# Patient Record
Sex: Female | Born: 1960 | Race: Black or African American | Hispanic: No | Marital: Married | Smoking: Never smoker
Health system: Southern US, Community
[De-identification: ages and names within clinical notes are randomized; demographics above are authoritative.]

## PROBLEM LIST (undated history)

## (undated) DIAGNOSIS — I10 Essential (primary) hypertension: Secondary | ICD-10-CM

## (undated) DIAGNOSIS — M1990 Unspecified osteoarthritis, unspecified site: Secondary | ICD-10-CM

## (undated) DIAGNOSIS — R3129 Other microscopic hematuria: Secondary | ICD-10-CM

## (undated) DIAGNOSIS — Z86018 Personal history of other benign neoplasm: Secondary | ICD-10-CM

## (undated) DIAGNOSIS — Z86010 Personal history of colonic polyps: Secondary | ICD-10-CM

## (undated) DIAGNOSIS — N2889 Other specified disorders of kidney and ureter: Secondary | ICD-10-CM

## (undated) DIAGNOSIS — E785 Hyperlipidemia, unspecified: Secondary | ICD-10-CM

## (undated) DIAGNOSIS — N289 Disorder of kidney and ureter, unspecified: Secondary | ICD-10-CM

## (undated) DIAGNOSIS — Z860101 Personal history of adenomatous and serrated colon polyps: Secondary | ICD-10-CM

## (undated) DIAGNOSIS — Z973 Presence of spectacles and contact lenses: Secondary | ICD-10-CM

## (undated) DIAGNOSIS — K219 Gastro-esophageal reflux disease without esophagitis: Secondary | ICD-10-CM

## (undated) HISTORY — DX: Essential (primary) hypertension: I10

## (undated) HISTORY — PX: TONSILLECTOMY: SUR1361

---

## 2001-04-07 ENCOUNTER — Ambulatory Visit (HOSPITAL_COMMUNITY): Admission: RE | Admit: 2001-04-07 | Discharge: 2001-04-07 | Payer: Self-pay | Admitting: *Deleted

## 2002-07-19 ENCOUNTER — Encounter: Payer: Self-pay | Admitting: Family Medicine

## 2002-07-19 ENCOUNTER — Ambulatory Visit (HOSPITAL_COMMUNITY): Admission: RE | Admit: 2002-07-19 | Discharge: 2002-07-19 | Payer: Self-pay | Admitting: Family Medicine

## 2002-07-19 ENCOUNTER — Encounter (INDEPENDENT_AMBULATORY_CARE_PROVIDER_SITE_OTHER): Payer: Self-pay | Admitting: *Deleted

## 2003-05-09 ENCOUNTER — Emergency Department (HOSPITAL_COMMUNITY): Admission: EM | Admit: 2003-05-09 | Discharge: 2003-05-09 | Payer: Self-pay | Admitting: Emergency Medicine

## 2004-06-26 ENCOUNTER — Ambulatory Visit (HOSPITAL_COMMUNITY): Admission: RE | Admit: 2004-06-26 | Discharge: 2004-06-26 | Payer: Self-pay | Admitting: Family Medicine

## 2005-03-31 ENCOUNTER — Other Ambulatory Visit: Admission: RE | Admit: 2005-03-31 | Discharge: 2005-03-31 | Payer: Self-pay | Admitting: Family Medicine

## 2005-05-24 ENCOUNTER — Ambulatory Visit (HOSPITAL_COMMUNITY): Admission: RE | Admit: 2005-05-24 | Discharge: 2005-05-24 | Payer: Self-pay | Admitting: *Deleted

## 2005-05-24 ENCOUNTER — Encounter (INDEPENDENT_AMBULATORY_CARE_PROVIDER_SITE_OTHER): Payer: Self-pay | Admitting: Specialist

## 2005-07-16 ENCOUNTER — Encounter: Admission: RE | Admit: 2005-07-16 | Discharge: 2005-07-16 | Payer: Self-pay | Admitting: Family Medicine

## 2005-12-06 ENCOUNTER — Emergency Department (HOSPITAL_COMMUNITY): Admission: EM | Admit: 2005-12-06 | Discharge: 2005-12-06 | Payer: Self-pay | Admitting: Emergency Medicine

## 2006-02-14 ENCOUNTER — Encounter: Admission: RE | Admit: 2006-02-14 | Discharge: 2006-02-14 | Payer: Self-pay | Admitting: Family Medicine

## 2006-07-13 ENCOUNTER — Ambulatory Visit (HOSPITAL_COMMUNITY): Admission: RE | Admit: 2006-07-13 | Discharge: 2006-07-13 | Payer: Self-pay | Admitting: Family Medicine

## 2007-07-17 ENCOUNTER — Ambulatory Visit (HOSPITAL_COMMUNITY): Admission: RE | Admit: 2007-07-17 | Discharge: 2007-07-17 | Payer: Self-pay | Admitting: Family Medicine

## 2007-09-07 ENCOUNTER — Other Ambulatory Visit: Admission: RE | Admit: 2007-09-07 | Discharge: 2007-09-07 | Payer: Self-pay | Admitting: Family Medicine

## 2008-09-09 ENCOUNTER — Ambulatory Visit (HOSPITAL_COMMUNITY): Admission: RE | Admit: 2008-09-09 | Discharge: 2008-09-09 | Payer: Self-pay | Admitting: Gastroenterology

## 2009-08-27 ENCOUNTER — Other Ambulatory Visit: Admission: RE | Admit: 2009-08-27 | Discharge: 2009-08-27 | Payer: Self-pay | Admitting: Family Medicine

## 2010-01-09 ENCOUNTER — Ambulatory Visit (HOSPITAL_COMMUNITY): Admission: RE | Admit: 2010-01-09 | Discharge: 2010-01-09 | Payer: Self-pay | Admitting: Family Medicine

## 2010-09-04 ENCOUNTER — Other Ambulatory Visit: Admission: RE | Admit: 2010-09-04 | Discharge: 2010-09-04 | Payer: Self-pay | Admitting: Family Medicine

## 2010-12-24 ENCOUNTER — Other Ambulatory Visit: Payer: Self-pay | Admitting: Family Medicine

## 2010-12-24 DIAGNOSIS — Z0000 Encounter for general adult medical examination without abnormal findings: Secondary | ICD-10-CM

## 2011-01-11 ENCOUNTER — Ambulatory Visit (HOSPITAL_COMMUNITY)
Admission: RE | Admit: 2011-01-11 | Payer: BC Managed Care – PPO | Source: Ambulatory Visit | Attending: *Deleted | Admitting: *Deleted

## 2011-01-11 ENCOUNTER — Ambulatory Visit (HOSPITAL_COMMUNITY): Admission: RE | Admit: 2011-01-11 | Payer: Self-pay | Source: Home / Self Care | Admitting: Family Medicine

## 2011-01-14 ENCOUNTER — Other Ambulatory Visit: Payer: Self-pay | Admitting: Family Medicine

## 2011-01-14 ENCOUNTER — Other Ambulatory Visit (HOSPITAL_COMMUNITY): Payer: Self-pay | Admitting: Family Medicine

## 2011-01-14 DIAGNOSIS — Z1231 Encounter for screening mammogram for malignant neoplasm of breast: Secondary | ICD-10-CM

## 2011-01-15 ENCOUNTER — Ambulatory Visit (HOSPITAL_COMMUNITY): Payer: BC Managed Care – PPO

## 2011-01-18 ENCOUNTER — Ambulatory Visit (HOSPITAL_COMMUNITY)
Admission: RE | Admit: 2011-01-18 | Discharge: 2011-01-18 | Disposition: A | Discharge status: Home / Self Care | Payer: BC Managed Care – PPO | Source: Ambulatory Visit | Attending: Diagnostic Radiology | Admitting: Diagnostic Radiology

## 2011-01-18 DIAGNOSIS — Z1231 Encounter for screening mammogram for malignant neoplasm of breast: Secondary | ICD-10-CM | POA: Insufficient documentation

## 2011-04-09 ENCOUNTER — Other Ambulatory Visit (HOSPITAL_COMMUNITY): Payer: Self-pay | Admitting: Family Medicine

## 2011-04-09 DIAGNOSIS — Z1231 Encounter for screening mammogram for malignant neoplasm of breast: Secondary | ICD-10-CM

## 2011-09-08 ENCOUNTER — Other Ambulatory Visit (HOSPITAL_COMMUNITY)
Admission: RE | Admit: 2011-09-08 | Discharge: 2011-09-08 | Disposition: A | Discharge status: Home / Self Care | Payer: BC Managed Care – PPO | Source: Ambulatory Visit | Attending: Family Medicine | Admitting: Family Medicine

## 2011-09-08 ENCOUNTER — Other Ambulatory Visit: Payer: Self-pay | Admitting: Family Medicine

## 2011-09-08 DIAGNOSIS — Z124 Encounter for screening for malignant neoplasm of cervix: Secondary | ICD-10-CM | POA: Insufficient documentation

## 2011-12-21 ENCOUNTER — Other Ambulatory Visit: Payer: Self-pay | Admitting: Family Medicine

## 2011-12-21 DIAGNOSIS — N852 Hypertrophy of uterus: Secondary | ICD-10-CM

## 2011-12-28 ENCOUNTER — Other Ambulatory Visit: Payer: BC Managed Care – PPO

## 2011-12-30 ENCOUNTER — Ambulatory Visit
Admission: RE | Admit: 2011-12-30 | Discharge: 2011-12-30 | Disposition: A | Discharge status: Home / Self Care | Payer: BC Managed Care – PPO | Source: Ambulatory Visit | Attending: Family Medicine | Admitting: Family Medicine

## 2011-12-30 DIAGNOSIS — N852 Hypertrophy of uterus: Secondary | ICD-10-CM

## 2012-01-03 ENCOUNTER — Other Ambulatory Visit (HOSPITAL_COMMUNITY): Payer: Self-pay | Admitting: Family Medicine

## 2012-01-03 DIAGNOSIS — Z1231 Encounter for screening mammogram for malignant neoplasm of breast: Secondary | ICD-10-CM

## 2012-01-28 ENCOUNTER — Ambulatory Visit (HOSPITAL_COMMUNITY)
Admission: RE | Admit: 2012-01-28 | Discharge: 2012-01-28 | Disposition: A | Discharge status: Home / Self Care | Payer: BC Managed Care – PPO | Source: Ambulatory Visit | Attending: Family Medicine | Admitting: Family Medicine

## 2012-01-28 DIAGNOSIS — Z1231 Encounter for screening mammogram for malignant neoplasm of breast: Secondary | ICD-10-CM | POA: Insufficient documentation

## 2013-01-31 ENCOUNTER — Other Ambulatory Visit (HOSPITAL_COMMUNITY): Payer: Self-pay | Admitting: Family Medicine

## 2013-02-06 ENCOUNTER — Ambulatory Visit (HOSPITAL_COMMUNITY)
Admission: RE | Admit: 2013-02-06 | Discharge: 2013-02-06 | Disposition: A | Discharge status: Home / Self Care | Payer: BC Managed Care – PPO | Source: Ambulatory Visit | Attending: Family Medicine | Admitting: Family Medicine

## 2013-02-06 DIAGNOSIS — Z1231 Encounter for screening mammogram for malignant neoplasm of breast: Secondary | ICD-10-CM | POA: Insufficient documentation

## 2014-01-04 ENCOUNTER — Other Ambulatory Visit (HOSPITAL_COMMUNITY): Payer: Self-pay | Admitting: Family Medicine

## 2014-01-04 DIAGNOSIS — Z1231 Encounter for screening mammogram for malignant neoplasm of breast: Secondary | ICD-10-CM

## 2014-02-07 ENCOUNTER — Ambulatory Visit (HOSPITAL_COMMUNITY)
Admission: RE | Admit: 2014-02-07 | Discharge: 2014-02-07 | Disposition: A | Discharge status: Home / Self Care | Payer: BC Managed Care – PPO | Source: Ambulatory Visit | Attending: Family Medicine | Admitting: Family Medicine

## 2014-02-07 DIAGNOSIS — Z1231 Encounter for screening mammogram for malignant neoplasm of breast: Secondary | ICD-10-CM

## 2015-01-17 ENCOUNTER — Other Ambulatory Visit (HOSPITAL_COMMUNITY): Payer: Self-pay | Admitting: Family Medicine

## 2015-01-17 DIAGNOSIS — Z1231 Encounter for screening mammogram for malignant neoplasm of breast: Secondary | ICD-10-CM

## 2015-02-13 ENCOUNTER — Ambulatory Visit: Payer: BLUE CROSS/BLUE SHIELD | Attending: Family Medicine | Admitting: Physical Therapy

## 2015-02-13 ENCOUNTER — Encounter: Payer: Self-pay | Admitting: Physical Therapy

## 2015-02-13 DIAGNOSIS — R29898 Other symptoms and signs involving the musculoskeletal system: Secondary | ICD-10-CM | POA: Diagnosis not present

## 2015-02-13 DIAGNOSIS — M25611 Stiffness of right shoulder, not elsewhere classified: Secondary | ICD-10-CM

## 2015-02-13 DIAGNOSIS — M25511 Pain in right shoulder: Secondary | ICD-10-CM | POA: Diagnosis not present

## 2015-02-13 DIAGNOSIS — M7582 Other shoulder lesions, left shoulder: Secondary | ICD-10-CM | POA: Insufficient documentation

## 2015-02-13 DIAGNOSIS — M25512 Pain in left shoulder: Secondary | ICD-10-CM | POA: Insufficient documentation

## 2015-02-13 DIAGNOSIS — M25612 Stiffness of left shoulder, not elsewhere classified: Secondary | ICD-10-CM

## 2015-02-13 NOTE — Therapy (Signed)
[place], [place], [address] Phone: [phone]   Fax:  [phone]  Physical Therapy Evaluation  Patient Details  Name: Diane Anthony MRN: [id number] Date of Birth: [birth year] Referring Provider:  London Pepper, MD  Encounter Date: 02/13/2015      PT End of Session - 02/13/15 1419    Visit Number 1   Number of Visits 12   Date for PT Re-Evaluation 04/15/15   PT Start Time 1330   PT Stop Time 1418   PT Time Calculation (min) 48 min   Activity Tolerance Patient tolerated treatment well   Behavior During Therapy [facility] for tasks assessed/performed      Past Medical History  Diagnosis Date  . Hypertension     No past surgical history on file.  There were no vitals filed for this visit.  Visit Diagnosis:  Bilateral shoulder pain - Plan: PT plan of care cert/re-cert  Weakness of shoulder - Plan: PT plan of care cert/re-cert  Decreased ROM of left shoulder - Plan: PT plan of care cert/re-cert  Decreased ROM of right shoulder - Plan: PT plan of care cert/re-cert      Subjective Assessment - 02/13/15 1335    Symptoms pt is a 54 y.o. F with CC of bilateral shoulder pain with R >L. she states "I believe it was from when some picture frames fell on my R shoulder in 2001".  she reports have difficulty raising her arm up and increased pain during exercise with elbow pain while leaning on it, or using it to turn in bed.   the  pain has gradually gotten worse since onset .   Limitations Lifting;House hold activities;Standing;Walking   How long can you sit comfortably? 2-3 hours   How long can you stand comfortably? >8 hours   How long can you walk comfortably? >8 hours   Diagnostic tests x-ray  in 2001: impression slipped cervical disk and shoulder tendinitis (per patient report)   Patient Stated Goals to be able to use arm more without pain.    Currently in Pain? Yes   Pain Score 6    Pain Location  Shoulder   Pain Orientation Right;Left;Anterior   Pain Descriptors / Indicators Aching   Pain Type Chronic pain   Pain Onset More than a month ago   Pain Frequency Intermittent   Aggravating Factors  overhead lifting, lifting and carrying activites, donning/doffing clothing   Pain Relieving Factors Sit and relax,    Effect of Pain on Daily Activities limited strenght to lift and carry items, and decreased endurance            [facility] PT Assessment - 02/13/15 1343    Assessment   Medical Diagnosis Bil shoulder pain   Onset Date 05/20/00   Next MD Visit no appointment  schedule as needed   Prior Therapy yes  R shoulder/ same pain   Precautions   Precautions None   Restrictions   Weight Bearing Restrictions No   Balance Screen   Has the patient fallen in the past 6 months No   Has the patient had a decrease in activity level because of a fear of falling?  No   Is the patient reluctant to leave their home because of a fear of falling?  No   Home Environment   Living Enviornment Private residence   Living Arrangements Spouse/significant other   Available Help at Discharge Available 24 hours/day   Type of Home House  Home Access --   Home Layout Two level   Alternate Level Stairs-Number of Steps 15   Alternate Level Stairs-Rails Right   Prior Function   Level of Independence Independent with basic ADLs;Independent with homemaking with ambulation;Independent with gait;Independent with transfers   Observation/Other Assessments   Focus on Therapeutic Outcomes (FOTO)  62% impaired  projected change to 36% impaired   Posture/Postural Control   Posture/Postural Control Postural limitations   Postural Limitations Rounded Shoulders;Forward head   ROM / Strength   AROM / PROM / Strength AROM;PROM;Strength   AROM   AROM Assessment Site Shoulder   Right/Left Shoulder Right;Left   Right Shoulder Extension 50 Degrees   Right Shoulder Flexion 85 Degrees  pain at end range   Right  Shoulder ABduction 108 Degrees   Right Shoulder Internal Rotation 16 Degrees   Right Shoulder External Rotation 25 Degrees   Left Shoulder Extension 75 Degrees   Left Shoulder Flexion 82 Degrees  pain during movement   Left Shoulder ABduction 112 Degrees   Left Shoulder Internal Rotation 83 Degrees   Left Shoulder External Rotation 74 Degrees   PROM   Overall PROM  Within functional limits for tasks performed;Other (comment)  BIL   PROM Assessment Site Shoulder   Right/Left Shoulder Right;Left   Strength   Strength Assessment Site Shoulder   Right/Left Shoulder Right;Left   Right Shoulder Flexion 3-/5  pain   Right Shoulder Extension 3-/5  increased pain   Right Shoulder ABduction 3-/5   Right Shoulder Internal Rotation 3-/5   Right Shoulder External Rotation 3+/5   Left Shoulder Flexion 3-/5  pain in shoulder   Left Shoulder Extension 4+/5   Left Shoulder ABduction 3/5   Left Shoulder Internal Rotation 4/5   Left Shoulder External Rotation 4/5   Palpation   Palpation Tenderness in the proximal anterior aspect of Bil shoulders at the long head of the biceps tendon   Special Tests   Rotator Cuff Impingment tests Michel Bickers test;Empty Can test;Full Can test;Neer impingement test;Painful Arc of Motion   Neer Impingement test    Findings Positive   Side Right   Hawkins-Kennedy test   Findings Positive   Side Right   Empty Can test   Findings Positive   Side Right   Full Can test   Findings Positive   Side Right   Painful Arc of Motion   Findings Positive   Side Right                   [facility] Adult PT Treatment/Exercise - 02/13/15 1343    Shoulder Exercises: Standing   External Rotation AROM;Strengthening;Both;10 reps;Theraband   Theraband Level (Shoulder External Rotation) Level 2 (Red)   Internal Rotation AROM;Strengthening;Both;10 reps;Theraband   Theraband Level (Shoulder Internal Rotation) Level 2 (Red)   Other Standing Exercises Wall push-ups  x 10   Shoulder Exercises: Stretch   Other Shoulder Stretches sleeper stretch against the wall  3 x 30 sec                PT Education - 02/13/15 1419    Education provided Yes   Education Details evaluation findings, POC, goals, HEP   Person(s) Educated Patient   Methods Explanation   Comprehension Verbalized understanding          PT Short Term Goals - 02/13/15 1431    PT SHORT TERM GOAL #1   Title Pt will be I with Basic HEP (02/13/15)   Time 3  Period Weeks   Status New           PT Long Term Goals - 02/13/15 1432    PT LONG TERM GOAL #1   Title pt will be independent with advanced HEP (02/13/15)   Time 6   Period Weeks   Status New   PT LONG TERM GOAL #2   Title Pt will increase Bil shoulder strength to > 4/5 to assist with ADLs and donning/doffing clothing (02/13/15)   Time 6   Period Weeks   Status New   PT LONG TERM GOAL #3   Title Pt will have < 2/10 pain during and after lifting and carrying activiites to help with functional progression (02/13/15)   Time 6   Period Weeks   Status New   PT LONG TERM GOAL #4   Title Pt will increase FOTO score to > 65 points to assist with functional capcity (02/13/15)   Time 6   Period Weeks   Status New   PT LONG TERM GOAL #5   Title Pt will be able to verbalize and demonstrate techniques to reduce shoulder reinjury via, posture, lifting mechancis, and HEP (02/13/15)   Time 6   Period Weeks   Status New               Plan - 02/13/15 1420    Clinical Impression Statement Wanita presents to OPPT with CC of Bil shoulder pain R>L. She is tender at the long head of the biceps tendon Bil. pt demonstrates limited AROM  with weakness in all planes secondary to increased pain.  R shoulder demonstrates positive hawkin kennedy, neers, and painful arc that is consitent with sholder impingement. pt reported she plans on getting surgery on her  shoulder but hasn't scheduled anything yet.  She would benefit from  skilled physical therapy to maximize her functinal capacity and reduce pain by addressing the defecits listed.   Pt will benefit from skilled therapeutic intervention in order to improve on the following deficits Decreased range of motion;Postural dysfunction;Decreased endurance;Decreased activity tolerance;Pain;Impaired UE functional use;Decreased mobility;Decreased strength;Impaired flexibility   Rehab Potential Good   PT Frequency 2x / week   PT Duration 6 weeks   PT Treatment/Interventions ADLs/Self Care Home Management;Moist Heat;Therapeutic activities;Therapeutic exercise;Passive range of motion;Patient/family education;Manual techniques;Ultrasound;Cryotherapy;Neuromuscular re-education;Electrical Stimulation   PT Next Visit Plan modalities for pain, scapular stabilizing activities and rotator cuff strengthening.    PT Home Exercise Plan wall push-ups, shoulder IR/ER, standing sleeper stretch   Consulted and Agree with Plan of Care Patient         Problem List There are no active problems to display for this patient.  Starr Lake PT, DPT, LAT, ATC  02/13/2015  2:40 PM   [facility] [address] [place], [place], [address] Phone: [phone]   Fax:  [phone]

## 2015-02-13 NOTE — Patient Instructions (Signed)
   [name] PT, DPT, LAT, ATC   Outpatient Rehabilitation Phone: [phone]     

## 2015-02-20 ENCOUNTER — Ambulatory Visit (HOSPITAL_COMMUNITY)
Admission: RE | Admit: 2015-02-20 | Discharge: 2015-02-20 | Disposition: A | Discharge status: Home / Self Care | Payer: BLUE CROSS/BLUE SHIELD | Source: Ambulatory Visit | Attending: Family Medicine | Admitting: Family Medicine

## 2015-02-20 DIAGNOSIS — Z1231 Encounter for screening mammogram for malignant neoplasm of breast: Secondary | ICD-10-CM | POA: Diagnosis not present

## 2015-02-25 ENCOUNTER — Ambulatory Visit: Payer: BLUE CROSS/BLUE SHIELD | Admitting: Physical Therapy

## 2015-02-27 ENCOUNTER — Encounter: Payer: BLUE CROSS/BLUE SHIELD | Admitting: Physical Therapy

## 2015-03-04 ENCOUNTER — Ambulatory Visit: Payer: BLUE CROSS/BLUE SHIELD | Admitting: Physical Therapy

## 2015-03-04 DIAGNOSIS — M25512 Pain in left shoulder: Principal | ICD-10-CM

## 2015-03-04 DIAGNOSIS — M25511 Pain in right shoulder: Secondary | ICD-10-CM

## 2015-03-04 DIAGNOSIS — M25611 Stiffness of right shoulder, not elsewhere classified: Secondary | ICD-10-CM

## 2015-03-04 DIAGNOSIS — M25612 Stiffness of left shoulder, not elsewhere classified: Secondary | ICD-10-CM

## 2015-03-04 DIAGNOSIS — R29898 Other symptoms and signs involving the musculoskeletal system: Secondary | ICD-10-CM

## 2015-03-04 NOTE — Therapy (Signed)
[place], [place], [address] Phone: [phone]   Fax:  [phone]  Physical Therapy Treatment  Patient Details  Name: Diane Anthony MRN: [id number] Date of Birth: 11/11/1961 Referring Provider:  London Pepper, MD  Encounter Date: 03/04/2015      PT End of Session - 03/04/15 1546    Visit Number 2   Number of Visits 12   Date for PT Re-Evaluation 04/15/15   PT Start Time 1500   PT Stop Time 1546   PT Time Calculation (min) 46 min   Activity Tolerance Patient tolerated treatment well   Behavior During Therapy [facility] for tasks assessed/performed      Past Medical History  Diagnosis Date  . Hypertension     No past surgical history on file.  There were no vitals filed for this visit.  Visit Diagnosis:  Bilateral shoulder pain  Weakness of shoulder  Decreased ROM of left shoulder  Decreased ROM of right shoulder      Subjective Assessment - 03/04/15 1504    Symptoms pt reports that she has been doing her HEP since the last vist 19 days ago. "I have only a little bit of pain today"   Currently in Pain? Yes   Pain Score 2    Pain Location Shoulder   Pain Orientation Right;Left   Pain Descriptors / Indicators Aching   Pain Type Chronic pain   Pain Onset More than a month ago   Pain Frequency Intermittent                       [facility] Adult PT Treatment/Exercise - 03/04/15 0001    Shoulder Exercises: Supine   Protraction AROM;Strengthening;Both;15 reps;Weights  ceiling punches   Protraction Weight (lbs) 2#   Shoulder Exercises: Standing   External Rotation AROM;Strengthening;Both;Theraband;15 reps   Theraband Level (Shoulder External Rotation) Level 2 (Red)   Internal Rotation AROM;Strengthening;Both;Theraband;15 reps   Theraband Level (Shoulder Internal Rotation) Level 2 (Red)   Row AROM;Strengthening;Both;15 reps;Theraband   Theraband Level (Shoulder Row) Level 2  (Red)   Other Standing Exercises scaption 2 x 8   with red theraband   Shoulder Exercises: ROM/Strengthening   UBE (Upper Arm Bike) L 1.5 x 6 min alt direction every 2 min   Manual Therapy   Manual Therapy Joint mobilization   Joint Mobilization scapular mobilzation in all planes grade 3 oscillations                PT Education - 03/04/15 1546    Education provided Yes   Education Details HEP   Person(s) Educated Patient   Methods Explanation   Comprehension Verbalized understanding          PT Short Term Goals - 02/13/15 1431    PT SHORT TERM GOAL #1   Title Pt will be I with Basic HEP (02/13/15)   Time 3   Period Weeks   Status New           PT Long Term Goals - 02/13/15 1432    PT LONG TERM GOAL #1   Title pt will be independent with advanced HEP (02/13/15)   Time 6   Period Weeks   Status New   PT LONG TERM GOAL #2   Title Pt will increase Bil shoulder strength to > 4/5 to assist with ADLs and donning/doffing clothing (02/13/15)   Time 6   Period Weeks   Status New   PT  LONG TERM GOAL #3   Title Pt will have < 2/10 pain during and after lifting and carrying activiites to help with functional progression (02/13/15)   Time 6   Period Weeks   Status New   PT LONG TERM GOAL #4   Title Pt will increase FOTO score to > 65 points to assist with functional capcity (02/13/15)   Time 6   Period Weeks   Status New   PT LONG TERM GOAL #5   Title Pt will be able to verbalize and demonstrate techniques to reduce shoulder reinjury via, posture, lifting mechancis, and HEP (02/13/15)   Time 6   Period Weeks   Status New               Plan - 03/04/15 1745    Clinical Impression Statement Latria has made some progress with decreased R shoulder pain.  She was able to perform all exercises well. PT modified wall push-ups to ceiling punches to decrese low back  pain. Following scapular mobilzation she reported no pain during full flexion and abduction.  Added   ceiling punches and scaption exercises to her HEP.    PT Next Visit Plan modalities for pain, scapular stabilizing activities and rotator cuff strengthening, scapular mobilization.    PT Home Exercise Plan ceiling punches, scaption   Consulted and Agree with Plan of Care Patient        Problem List There are no active problems to display for this patient.  Starr Lake PT, DPT, LAT, ATC  03/04/2015  5:47 PM   [facility] [address] [place], [place], [address] Phone: [phone]   Fax:  [phone]

## 2015-03-04 NOTE — Patient Instructions (Signed)
   Thy Gullikson PT, DPT, LAT, ATC  [facility] Phone: [phone]     

## 2015-03-06 ENCOUNTER — Ambulatory Visit: Payer: BLUE CROSS/BLUE SHIELD | Admitting: Physical Therapy

## 2015-03-06 ENCOUNTER — Encounter (HOSPITAL_COMMUNITY): Payer: Self-pay

## 2015-03-06 ENCOUNTER — Other Ambulatory Visit: Payer: Self-pay

## 2015-03-06 ENCOUNTER — Encounter (HOSPITAL_COMMUNITY)
Admission: RE | Admit: 2015-03-06 | Discharge: 2015-03-06 | Disposition: A | Discharge status: Home / Self Care | Payer: BLUE CROSS/BLUE SHIELD | Source: Ambulatory Visit | Attending: Obstetrics and Gynecology | Admitting: Obstetrics and Gynecology

## 2015-03-06 DIAGNOSIS — M25511 Pain in right shoulder: Secondary | ICD-10-CM | POA: Diagnosis not present

## 2015-03-06 DIAGNOSIS — Z01812 Encounter for preprocedural laboratory examination: Secondary | ICD-10-CM | POA: Insufficient documentation

## 2015-03-06 DIAGNOSIS — M25512 Pain in left shoulder: Principal | ICD-10-CM

## 2015-03-06 DIAGNOSIS — R29898 Other symptoms and signs involving the musculoskeletal system: Secondary | ICD-10-CM

## 2015-03-06 DIAGNOSIS — M25612 Stiffness of left shoulder, not elsewhere classified: Secondary | ICD-10-CM

## 2015-03-06 DIAGNOSIS — M25611 Stiffness of right shoulder, not elsewhere classified: Secondary | ICD-10-CM

## 2015-03-06 HISTORY — DX: Gastro-esophageal reflux disease without esophagitis: K21.9

## 2015-03-06 HISTORY — DX: Hyperlipidemia, unspecified: E78.5

## 2015-03-06 LAB — CBC
HEMATOCRIT: 30.9 % — AB (ref 36.0–46.0)
HEMOGLOBIN: 9.7 g/dL — AB (ref 12.0–15.0)
MCH: 25.5 pg — AB (ref 26.0–34.0)
MCHC: 31.4 g/dL (ref 30.0–36.0)
MCV: 81.1 fL (ref 78.0–100.0)
PLATELETS: 419 10*3/uL — AB (ref 150–400)
RBC: 3.81 MIL/uL — AB (ref 3.87–5.11)
RDW: 16.1 % — ABNORMAL HIGH (ref 11.5–15.5)
WBC: 6.1 10*3/uL (ref 4.0–10.5)

## 2015-03-06 LAB — BASIC METABOLIC PANEL
ANION GAP: 6 (ref 5–15)
BUN: 20 mg/dL (ref 6–23)
CHLORIDE: 105 mmol/L (ref 96–112)
CO2: 28 mmol/L (ref 19–32)
CREATININE: 0.74 mg/dL (ref 0.50–1.10)
Calcium: 8.9 mg/dL (ref 8.4–10.5)
GFR calc non Af Amer: >90 mL/min (ref >90)
Glucose, Bld: 97 mg/dL (ref 70–99)
Potassium: 4 mmol/L (ref 3.5–5.1)
Sodium: 139 mmol/L (ref 135–145)

## 2015-03-06 NOTE — Patient Instructions (Addendum)
   Your procedure is scheduled on:  Wednesday, April 13  Enter through the [employer] [facility] at: 7 AM Pick up the phone at the desk and dial [phone] and inform us of your arrival.  Please call this number if you have any problems the morning of surgery: [phone]  Remember: Do not eat or drink after midnight: Tuesday Take these medicines the morning of surgery with a SIP OF WATER: nexium, hctz, zocor  Do not wear jewelry, make-up, or FINGER nail polish No metal in your hair or on your body. Do not wear lotions, powders, perfumes.  You may wear deodorant.  Do not bring valuables to the hospital. Contacts, dentures or bridgework may not be worn into surgery.  Leave suitcase in the car. After Surgery it may be brought to your room. For patients being admitted to the hospital, checkout time is 11:00am the day of discharge.  Home with husband Gwyndolyn Saxon cell [phone] or daughter Maurine Simmering

## 2015-03-06 NOTE — Therapy (Signed)
[place], [place], [address] Phone: [phone]   Fax:  [phone]  Physical Therapy Treatment  Patient Details  Name: Diane Anthony MRN: [id number] Date of Birth: August 14, 1961 Referring Provider:  London Pepper, MD  Encounter Date: 03/06/2015      PT End of Session - 03/06/15 1524    Visit Number 3   Number of Visits 12   Date for PT Re-Evaluation 04/15/15   PT Start Time 1500   PT Stop Time 1550   PT Time Calculation (min) 50 min   Activity Tolerance Patient tolerated treatment well   Behavior During Therapy [facility] for tasks assessed/performed      Past Medical History  Diagnosis Date  . Hypertension   . Hyperlipidemia   . GERD (gastroesophageal reflux disease)   . SVD (spontaneous vaginal delivery)     x 2  . Depression     no meds    Past Surgical History  Procedure Laterality Date  . Tonsillectomy    . Colonoscopy    . Upper gi endoscopy      There were no vitals filed for this visit.  Visit Diagnosis:  Bilateral shoulder pain  Weakness of shoulder  Decreased ROM of right shoulder  Decreased ROM of left shoulder      Subjective Assessment - 03/06/15 1504    Symptoms pt reports that she is feeling good today since the last visit.    Currently in Pain? Yes   Pain Score 2    Pain Location Shoulder   Pain Orientation Right;Left   Pain Descriptors / Indicators Aching   Pain Type Chronic pain   Pain Onset More than a month ago   Pain Frequency Intermittent                       [facility] Adult PT Treatment/Exercise - 03/06/15 1506    Shoulder Exercises: Standing   Protraction AROM;Strengthening;Right;15 reps   Theraband Level (Shoulder Protraction) Level 2 (Red)   External Rotation AROM;Strengthening;Both;Theraband;15 reps   Theraband Level (Shoulder External Rotation) Level 2 (Red)   Internal Rotation AROM;Strengthening;Both;Theraband;15 reps   Theraband  Level (Shoulder Internal Rotation) Level 2 (Red)   Row AROM;Strengthening;Both;15 reps;Theraband   Theraband Level (Shoulder Row) Level 2 (Red)   Retraction AROM;Strengthening;Both;15 reps;Theraband   Theraband Level (Shoulder Retraction) Level 2 (Red)   Other Standing Exercises scaption x15  yellow theraband   Shoulder Exercises: ROM/Strengthening   UBE (Upper Arm Bike) L 1.5 x 6 min alt direction every 2 min   Modalities   Modalities Moist Heat   Moist Heat Therapy   Number Minutes Moist Heat 10 Minutes   Moist Heat Location Shoulder   Manual Therapy   Manual Therapy Joint mobilization   Joint Mobilization scapular mobilzation in all planes grade 3 oscillations                PT Education - 03/06/15 1531    Education provided Yes   Education Details Rotator cuff anatomy education   Person(s) Educated Patient   Methods Explanation   Comprehension Verbalized understanding          PT Short Term Goals - 02/13/15 1431    PT SHORT TERM GOAL #1   Title Pt will be I with Basic HEP (02/13/15)   Time 3   Period Weeks   Status New           PT Long Term Goals -  02/13/15 1432    PT LONG TERM GOAL #1   Title pt will be independent with advanced HEP (02/13/15)   Time 6   Period Weeks   Status New   PT LONG TERM GOAL #2   Title Pt will increase Bil shoulder strength to > 4/5 to assist with ADLs and donning/doffing clothing (02/13/15)   Time 6   Period Weeks   Status New   PT LONG TERM GOAL #3   Title Pt will have < 2/10 pain during and after lifting and carrying activiites to help with functional progression (02/13/15)   Time 6   Period Weeks   Status New   PT LONG TERM GOAL #4   Title Pt will increase FOTO score to > 65 points to assist with functional capcity (02/13/15)   Time 6   Period Weeks   Status New   PT LONG TERM GOAL #5   Title Pt will be able to verbalize and demonstrate techniques to reduce shoulder reinjury via, posture, lifting mechancis, and HEP  (02/13/15)   Time 6   Period Weeks   Status New               Plan - 03/06/15 1529    Clinical Impression Statement Diane Anthony has some increased soreness today but reports some relief following scapular mobilizations and was able to tolerate all exercises with minimal soreness.     PT Next Visit Plan modalities for pain, scapular stabilizing activities and rotator cuff strengthening, scapular mobilization.    PT Home Exercise Plan same as previous   Consulted and Agree with Plan of Care Patient        Problem List There are no active problems to display for this patient.  Starr Lake PT, DPT, LAT, ATC  03/06/2015  3:51 PM   [facility] [address] [place], [place], [address] Phone: [phone]   Fax:  [phone]

## 2015-03-11 ENCOUNTER — Ambulatory Visit: Payer: BLUE CROSS/BLUE SHIELD | Attending: Family Medicine | Admitting: Physical Therapy

## 2015-03-11 DIAGNOSIS — M25512 Pain in left shoulder: Secondary | ICD-10-CM | POA: Diagnosis not present

## 2015-03-11 DIAGNOSIS — M7582 Other shoulder lesions, left shoulder: Secondary | ICD-10-CM | POA: Diagnosis not present

## 2015-03-11 DIAGNOSIS — M25511 Pain in right shoulder: Secondary | ICD-10-CM

## 2015-03-11 DIAGNOSIS — R29898 Other symptoms and signs involving the musculoskeletal system: Secondary | ICD-10-CM | POA: Diagnosis not present

## 2015-03-11 DIAGNOSIS — M25611 Stiffness of right shoulder, not elsewhere classified: Secondary | ICD-10-CM
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
## 2015-03-13 ENCOUNTER — Encounter: Payer: BLUE CROSS/BLUE SHIELD | Admitting: Physical Therapy

## 2015-03-14 ENCOUNTER — Emergency Department (HOSPITAL_COMMUNITY): Payer: BLUE CROSS/BLUE SHIELD

## 2015-03-14 ENCOUNTER — Emergency Department (HOSPITAL_COMMUNITY)
Admission: EM | Admit: 2015-03-14 | Discharge: 2015-03-14 | Disposition: A | Discharge status: Home / Self Care | Payer: BLUE CROSS/BLUE SHIELD | Attending: Emergency Medicine | Admitting: Emergency Medicine

## 2015-03-14 DIAGNOSIS — E785 Hyperlipidemia, unspecified: Secondary | ICD-10-CM | POA: Insufficient documentation

## 2015-03-14 DIAGNOSIS — I10 Essential (primary) hypertension: Secondary | ICD-10-CM | POA: Insufficient documentation

## 2015-03-14 DIAGNOSIS — Y998 Other external cause status: Secondary | ICD-10-CM | POA: Diagnosis not present

## 2015-03-14 DIAGNOSIS — Z8659 Personal history of other mental and behavioral disorders: Secondary | ICD-10-CM | POA: Insufficient documentation

## 2015-03-14 DIAGNOSIS — Y92410 Unspecified street and highway as the place of occurrence of the external cause: Secondary | ICD-10-CM | POA: Diagnosis not present

## 2015-03-14 DIAGNOSIS — K219 Gastro-esophageal reflux disease without esophagitis: Secondary | ICD-10-CM | POA: Insufficient documentation

## 2015-03-14 DIAGNOSIS — S4991XA Unspecified injury of right shoulder and upper arm, initial encounter: Secondary | ICD-10-CM | POA: Insufficient documentation

## 2015-03-14 DIAGNOSIS — Y9389 Activity, other specified: Secondary | ICD-10-CM | POA: Insufficient documentation

## 2015-03-14 DIAGNOSIS — Z79899 Other long term (current) drug therapy: Secondary | ICD-10-CM | POA: Insufficient documentation

## 2015-03-14 DIAGNOSIS — M25511 Pain in right shoulder: Secondary | ICD-10-CM

## 2015-03-14 MED ORDER — NAPROXEN 250 MG PO TABS: 250.0000 mg | ORAL_TABLET | Freq: Two times a day (BID) | ORAL | Status: DC
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
## 2015-03-14 NOTE — Discharge Instructions (Signed)
Shoulder Pain The shoulder is the joint that connects your arms to your body. The bones that form the shoulder joint include the upper arm bone (humerus), the shoulder blade (scapula), and the collarbone (clavicle). The top of the humerus is shaped like a ball and fits into a rather flat socket on the scapula (glenoid cavity). A combination of muscles and strong, fibrous tissues that connect muscles to bones (tendons) support your shoulder joint and hold the ball in the socket. Small, fluid-filled sacs (bursae) are located in different areas of the joint. They act as cushions between the bones and the overlying soft tissues and help reduce friction between the gliding tendons and the bone as you move your arm. Your shoulder joint allows a wide range of motion in your arm. This range of motion allows you to do things like scratch your back or throw a ball. However, this range of motion also makes your shoulder more prone to pain from overuse and injury. Causes of shoulder pain can originate from both injury and overuse and usually can be grouped in the following four categories:  Redness, swelling, and pain (inflammation) of the tendon (tendinitis) or the bursae (bursitis).  Instability, such as a dislocation of the joint.  Inflammation of the joint (arthritis).  Broken bone (fracture). HOME CARE INSTRUCTIONS   Apply ice to the sore area.  Put ice in a plastic bag.  Place a towel between your skin and the bag.  Leave the ice on for 15-20 minutes, 3-4 times per day for the first 2 days, or as directed by your health care provider.  Stop using cold packs if they do not help with the pain.  If you have a shoulder sling or immobilizer, wear it as long as your caregiver instructs. Only remove it to shower or bathe. Move your arm as little as possible, but keep your hand moving to prevent swelling.  Squeeze a soft ball or foam pad as much as possible to help prevent swelling.  Only take  over-the-counter or prescription medicines for pain, discomfort, or fever as directed by your caregiver. SEEK MEDICAL CARE IF:   Your shoulder pain increases, or new pain develops in your arm, hand, or fingers.  Your hand or fingers become cold and numb.  Your pain is not relieved with medicines. SEEK IMMEDIATE MEDICAL CARE IF:   Your arm, hand, or fingers are numb or tingling.  Your arm, hand, or fingers are significantly swollen or turn white or blue. MAKE SURE YOU:   Understand these instructions.  Will watch your condition.  Will get help right away if you are not doing well or get worse. Document Released: 09/01/2005 Document Revised: 04/08/2014 Document Reviewed: 11/06/2011 [facility] Patient Information 2015 [place], [place]. This information is not intended to replace advice given to you by your health care provider. Make sure you discuss any questions you have with your health care provider. Shoulder Range of Motion Exercises The shoulder is the most flexible joint in the human body. Because of this it is also the most unstable joint in the body. All ages can develop shoulder problems. Early treatment of problems is necessary for a good outcome. People react to shoulder pain by decreasing the movement of the joint. After a brief period of time, the shoulder can become "frozen". This is an almost complete loss of the ability to move the damaged shoulder. Following injuries your caregivers can give you instructions on exercises to keep your range of motion (ability to  move your shoulder freely), or regain it if it has been lost.  EXERCISES EXERCISES TO MAINTAIN THE MOBILITY OF YOUR SHOULDER: Codman's Exercise or Pendulum Exercise  This exercise may be performed in a prone (face-down) lying position or standing while leaning on a chair with the opposite arm. Its purpose is to relax the muscles in your shoulder and slowly but surely increase the range of motion and to relieve  pain.  Lie on your stomach close to the side edge of the bed. Let your weak arm hang over the edge of the bed. Relax your shoulder, arm and hand. Let your shoulder blade relax and drop down.  Slowly and gently swing your arm forward and back. Do not use your neck muscles; relax them. It might be easier to have someone else gently start swinging your arm.  As pain decreases, increase your swing. To start, arm swing should begin at 15 degree angles. In time and as pain lessens, move to 30-45 degree angles. Start with swinging for about 15 seconds, and work towards swinging for 3 to 5 minutes.  This exercise may also be performed in a standing/bent over position.  Stand and hold onto a sturdy chair with your good arm. Bend forward at the waist and bend your knees slightly to help protect your back. Relax your weak arm, let it hang limp. Relax your shoulder blade and let it drop.  Keep your shoulder relaxed and use body motion to swing your arm in small circles.  Stand up tall and relax.  Repeat motion and change direction of circles.  Start with swinging for about 30 seconds, and work towards swinging for 3 to 5 minutes. STRETCHING EXERCISES:  Lift your arm out in front of you with the elbow bent at 90 degrees. Using your other arm gently pull the elbow forward and across your body.  Bend one arm behind you with the palm facing outward. Using the other arm, hold a towel or rope and reach this arm up above your head, then bend it at the elbow to move your wrist to behind your neck. Grab the free end of the towel with the hand behind your back. Gently pull the towel up with the hand behind your neck, gradually increasing the pull on the hand behind the small of your back. Then, gradually pull down with the hand behind the small of your back. This will pull the hand and arm behind your neck further. Both shoulders will have an increased range of motion with repetition of this  exercise. STRENGTHENING EXERCISES:  Standing with your arm at your side and straight out from your shoulder with the elbow bent at 90 degrees, hold onto a small weight and slowly raise your hand so it points straight up in the air. Repeat this five times to begin with, and gradually increase to ten times. Do this four times per day. As you grow stronger you can gradually increase the weight.  Repeat the above exercise, only this time using an elastic band. Start with your hand up in the air and pull down until your hand is by your side. As you grow stronger, gradually increase the amount you pull by increasing the number or size of the elastic bands. Use the same amount of repetitions.  Standing with your hand at your side and holding onto a weight, gradually lift the hand in front of you until it is over your head. Do the same also with the hand remaining at  your side and lift the hand away from your body until it is again over your head. Repeat this five times to begin with, and gradually increase to ten times. Do this four times per day. As you grow stronger you can gradually increase the weight. Document Released: 08/21/2003 Document Revised: 11/27/2013 Document Reviewed: 11/22/2005 [facility] Patient Information 2015 [place], [place]. This information is not intended to replace advice given to you by your health care provider. Make sure you discuss any questions you have with your health care provider.
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
## 2015-03-18 ENCOUNTER — Encounter: Payer: BLUE CROSS/BLUE SHIELD | Admitting: Physical Therapy

## 2015-03-18 NOTE — Anesthesia Preprocedure Evaluation (Addendum)
Anesthesia Evaluation  Patient identified by MRN, date of birth, ID band Patient awake    Reviewed: Allergy & Precautions, NPO status , Patient's Chart, lab work & pertinent test results, reviewed documented beta blocker date and time   Airway Mallampati: II   Neck ROM: Full    Dental  (+) Teeth Intact, Dental Advisory Given   Pulmonary neg pulmonary ROS,  breath sounds clear to auscultation        Cardiovascular hypertension, Pt. on medications Rhythm:Regular  EKG 02/2015 WNL   Neuro/Psych Depression negative neurological ROS     GI/Hepatic GERD-  Medicated,  Endo/Other  negative endocrine ROS  Renal/GU negative Renal ROS     Musculoskeletal   Abdominal (+)  Abdomen: soft.    Peds  Hematology  (+) anemia , 9/31   Anesthesia Other Findings   Reproductive/Obstetrics                           Anesthesia Physical Anesthesia Plan  ASA: II  Anesthesia Plan: General   Post-op Pain Management:    Induction: Intravenous  Airway Management Planned: Oral ETT  Additional Equipment:   Intra-op Plan:   Post-operative Plan: Extubation in OR  Informed Consent: I have reviewed the patients History and Physical, chart, labs and discussed the procedure including the risks, benefits and alternatives for the proposed anesthesia with the patient or authorized representative who has indicated his/her understanding and acceptance.     Plan Discussed with:   Anesthesia Plan Comments: (Multimodal pain RX)        Anesthesia Quick Evaluation

## 2015-03-18 NOTE — H&P (Signed)
Diane Anthony is an 54 y.o. female.  She was seen as a new patient in December for annual exam and evaluation of fibroids. Having heavy menses every 3-4 weeks, moderate cramps. Rarely sexually active, too uncomfortable. Normal Pap about 2 years ago per her report, gets mammograms yearly, up to date with colonoscopy. Pelvic ultrasound in January 2013 with significantly enlarged uterus with several large fibroids.  Pertinent Gynecological History: G5 P2032, SVD at term x 2  Menstrual History: No LMP recorded.    Past Medical History  Diagnosis Date  . Hypertension   . Hyperlipidemia   . GERD (gastroesophageal reflux disease)   . SVD (spontaneous vaginal delivery)     x 2  . Depression     no meds    Past Surgical History  Procedure Laterality Date  . Tonsillectomy    . Colonoscopy    . Upper gi endoscopy      No family history on file.  Social History:  reports that she has never smoked. She has never used smokeless tobacco. She reports that she does not drink alcohol or use illicit drugs.  Allergies:  Allergies  Allergen Reactions  . Aspirin Other (See Comments)    Heartburn     No prescriptions prior to admission    Review of Systems  Respiratory: Negative.   Cardiovascular: Negative.   Gastrointestinal: Negative.   Genitourinary: Negative.     There were no vitals taken for this visit. Physical Exam  Constitutional: She appears well-developed and well-nourished.  Neck: Neck supple. No thyromegaly present.  Cardiovascular: Normal rate, regular rhythm and normal heart sounds.   No murmur heard. Respiratory: Effort normal and breath sounds normal. No respiratory distress. She has no wheezes.  GI: Soft. She exhibits no distension. There is no tenderness.  Uterine fundus at U+1  Genitourinary: Vagina normal.  Uterus about 20 weeks size No adnexal mass palpated    No results found for this or any previous visit (from the past 24 hour(s)).  No results  found.  Assessment/Plan: Symptomatic myomatous uterus, 20 weeks size.  Medical and surgical options discussed.  Surgical procedure, risks, alternatives, chances of relieving symptoms have all been discussed.  Will admit for TAH, bilateral salpingectomy.  Sheily Lineman D 03/18/2015, 7:46 PM

## 2015-03-19 ENCOUNTER — Inpatient Hospital Stay (HOSPITAL_COMMUNITY): Payer: BLUE CROSS/BLUE SHIELD | Admitting: Anesthesiology

## 2015-03-19 ENCOUNTER — Inpatient Hospital Stay (HOSPITAL_COMMUNITY)
Admission: RE | Admit: 2015-03-19 | Discharge: 2015-03-21 | DRG: 743 | Disposition: A | Discharge status: Home / Self Care | Payer: BLUE CROSS/BLUE SHIELD | Source: Ambulatory Visit | Attending: Obstetrics and Gynecology | Admitting: Obstetrics and Gynecology

## 2015-03-19 ENCOUNTER — Encounter (HOSPITAL_COMMUNITY)
Admission: RE | Disposition: A | Discharge status: Home / Self Care | Payer: Self-pay | Source: Ambulatory Visit | Attending: Obstetrics and Gynecology

## 2015-03-19 ENCOUNTER — Encounter (HOSPITAL_COMMUNITY): Payer: Self-pay | Admitting: Anesthesiology

## 2015-03-19 DIAGNOSIS — E785 Hyperlipidemia, unspecified: Secondary | ICD-10-CM | POA: Diagnosis present

## 2015-03-19 DIAGNOSIS — Z90710 Acquired absence of both cervix and uterus: Secondary | ICD-10-CM | POA: Diagnosis present

## 2015-03-19 DIAGNOSIS — F329 Major depressive disorder, single episode, unspecified: Secondary | ICD-10-CM | POA: Diagnosis present

## 2015-03-19 DIAGNOSIS — K219 Gastro-esophageal reflux disease without esophagitis: Secondary | ICD-10-CM | POA: Diagnosis present

## 2015-03-19 DIAGNOSIS — D259 Leiomyoma of uterus, unspecified: Principal | ICD-10-CM | POA: Diagnosis present

## 2015-03-19 DIAGNOSIS — Z886 Allergy status to analgesic agent status: Secondary | ICD-10-CM

## 2015-03-19 DIAGNOSIS — I10 Essential (primary) hypertension: Secondary | ICD-10-CM | POA: Diagnosis present

## 2015-03-19 HISTORY — PX: BILATERAL SALPINGECTOMY: SHX5743

## 2015-03-19 HISTORY — PX: ABDOMINAL HYSTERECTOMY: SHX81

## 2015-03-19 LAB — PREGNANCY, URINE: Preg Test, Ur: NEGATIVE

## 2015-03-19 SURGERY — HYSTERECTOMY, ABDOMINAL
Anesthesia: General | Site: Abdomen

## 2015-03-19 MED ORDER — SODIUM CHLORIDE 0.9 % IJ SOLN: 9.0000 mL | INTRAMUSCULAR | Status: DC | PRN

## 2015-03-19 MED ORDER — DIPHENHYDRAMINE HCL 12.5 MG/5ML PO ELIX: 12.5000 mg | ORAL_SOLUTION | Freq: Four times a day (QID) | ORAL | Status: DC | PRN

## 2015-03-19 MED ORDER — SIMETHICONE 80 MG PO CHEW: 80.0000 mg | CHEWABLE_TABLET | Freq: Four times a day (QID) | ORAL | Status: DC | PRN

## 2015-03-19 MED ORDER — LACTATED RINGERS IV SOLN
INTRAVENOUS | Status: DC | PRN
  Administered 2015-03-19: 07:00:00 via INTRAVENOUS

## 2015-03-19 MED ORDER — MIDAZOLAM HCL 2 MG/2ML IJ SOLN
INTRAMUSCULAR | Status: DC | PRN
  Administered 2015-03-19: 2 mg via INTRAVENOUS

## 2015-03-19 MED ORDER — ROCURONIUM BROMIDE 100 MG/10ML IV SOLN
INTRAVENOUS | Status: DC | PRN
  Administered 2015-03-19: 40 mg via INTRAVENOUS
  Administered 2015-03-19: 10 mg via INTRAVENOUS

## 2015-03-19 MED ORDER — NEOSTIGMINE METHYLSULFATE 10 MG/10ML IV SOLN
INTRAVENOUS | Status: AC
  Filled 2015-03-19: qty 1

## 2015-03-19 MED ORDER — HYDROMORPHONE 0.3 MG/ML IV SOLN
INTRAVENOUS | Status: DC
  Administered 2015-03-19: 1.79 mg via INTRAVENOUS
  Administered 2015-03-20: 0.6 mg via INTRAVENOUS
  Administered 2015-03-20 (×2): 0.9 mg via INTRAVENOUS

## 2015-03-19 MED ORDER — DEXTROSE-NACL 5-0.45 % IV SOLN
INTRAVENOUS | Status: DC
  Administered 2015-03-19 – 2015-03-20 (×2): via INTRAVENOUS

## 2015-03-19 MED ORDER — PROMETHAZINE HCL 25 MG/ML IJ SOLN: 6.2500 mg | INTRAMUSCULAR | Status: DC | PRN

## 2015-03-19 MED ORDER — SCOPOLAMINE 1 MG/3DAYS TD PT72
MEDICATED_PATCH | TRANSDERMAL | Status: AC
  Administered 2015-03-19: 1.5 mg via TRANSDERMAL
  Filled 2015-03-19: qty 1

## 2015-03-19 MED ORDER — NEOSTIGMINE METHYLSULFATE 10 MG/10ML IV SOLN
INTRAVENOUS | Status: DC | PRN
  Administered 2015-03-19: 4 mg via INTRAVENOUS

## 2015-03-19 MED ORDER — DIPHENHYDRAMINE HCL 50 MG/ML IJ SOLN: 12.5000 mg | Freq: Four times a day (QID) | INTRAMUSCULAR | Status: DC | PRN

## 2015-03-19 MED ORDER — PANTOPRAZOLE SODIUM 40 MG PO TBEC
40.0000 mg | DELAYED_RELEASE_TABLET | Freq: Every day | ORAL | Status: DC
  Administered 2015-03-20 – 2015-03-21 (×2): 40 mg via ORAL
  Filled 2015-03-19 (×2): qty 1

## 2015-03-19 MED ORDER — ONDANSETRON HCL 4 MG/2ML IJ SOLN
INTRAMUSCULAR | Status: AC
  Filled 2015-03-19: qty 2

## 2015-03-19 MED ORDER — SIMVASTATIN 40 MG PO TABS
40.0000 mg | ORAL_TABLET | Freq: Every day | ORAL | Status: DC
  Administered 2015-03-20: 40 mg via ORAL
  Filled 2015-03-19 (×3): qty 1

## 2015-03-19 MED ORDER — MENTHOL 3 MG MT LOZG: 1.0000 | LOZENGE | OROMUCOSAL | Status: DC | PRN

## 2015-03-19 MED ORDER — CEFAZOLIN SODIUM-DEXTROSE 2-3 GM-% IV SOLR
INTRAVENOUS | Status: AC
  Filled 2015-03-19: qty 50

## 2015-03-19 MED ORDER — FENTANYL CITRATE 0.05 MG/ML IJ SOLN
INTRAMUSCULAR | Status: AC
  Filled 2015-03-19: qty 2

## 2015-03-19 MED ORDER — MIDAZOLAM HCL 2 MG/2ML IJ SOLN
INTRAMUSCULAR | Status: AC
  Filled 2015-03-19: qty 2

## 2015-03-19 MED ORDER — FENTANYL CITRATE 0.05 MG/ML IJ SOLN
INTRAMUSCULAR | Status: AC
  Filled 2015-03-19: qty 5

## 2015-03-19 MED ORDER — FENTANYL CITRATE 0.05 MG/ML IJ SOLN
INTRAMUSCULAR | Status: DC | PRN
Start: 2015-03-19 — End: 2015-03-19
  Administered 2015-03-19: 50 ug via INTRAVENOUS
  Administered 2015-03-19: 100 ug via INTRAVENOUS
  Administered 2015-03-19 (×2): 50 ug via INTRAVENOUS
  Administered 2015-03-19: 100 ug via INTRAVENOUS

## 2015-03-19 MED ORDER — KETOROLAC TROMETHAMINE 30 MG/ML IJ SOLN: 30.0000 mg | Freq: Four times a day (QID) | INTRAMUSCULAR | Status: DC

## 2015-03-19 MED ORDER — PROPOFOL 10 MG/ML IV BOLUS
INTRAVENOUS | Status: DC | PRN
  Administered 2015-03-19: 180 mg via INTRAVENOUS

## 2015-03-19 MED ORDER — GLYCOPYRROLATE 0.2 MG/ML IJ SOLN
INTRAMUSCULAR | Status: DC | PRN
  Administered 2015-03-19: 0.6 mg via INTRAVENOUS

## 2015-03-19 MED ORDER — PROPOFOL 10 MG/ML IV BOLUS
INTRAVENOUS | Status: AC
  Filled 2015-03-19: qty 20

## 2015-03-19 MED ORDER — FENTANYL CITRATE 0.05 MG/ML IJ SOLN
25.0000 ug | INTRAMUSCULAR | Status: DC | PRN
  Administered 2015-03-19 (×2): 50 ug via INTRAVENOUS

## 2015-03-19 MED ORDER — IBUPROFEN 600 MG PO TABS: 600.0000 mg | ORAL_TABLET | Freq: Four times a day (QID) | ORAL | Status: DC | PRN

## 2015-03-19 MED ORDER — ALUM & MAG HYDROXIDE-SIMETH 200-200-20 MG/5ML PO SUSP: 30.0000 mL | ORAL | Status: DC | PRN

## 2015-03-19 MED ORDER — LIDOCAINE HCL (CARDIAC) 20 MG/ML IV SOLN
INTRAVENOUS | Status: DC | PRN
  Administered 2015-03-19: 30 mg via INTRAVENOUS
  Administered 2015-03-19: 70 mg via INTRAVENOUS

## 2015-03-19 MED ORDER — ROCURONIUM BROMIDE 100 MG/10ML IV SOLN
INTRAVENOUS | Status: AC
  Filled 2015-03-19: qty 1

## 2015-03-19 MED ORDER — SCOPOLAMINE 1 MG/3DAYS TD PT72
1.0000 | MEDICATED_PATCH | Freq: Once | TRANSDERMAL | Status: DC
  Administered 2015-03-19: 1.5 mg via TRANSDERMAL

## 2015-03-19 MED ORDER — OXYCODONE-ACETAMINOPHEN 5-325 MG PO TABS
1.0000 | ORAL_TABLET | ORAL | Status: DC | PRN
  Administered 2015-03-20 – 2015-03-21 (×5): 1 via ORAL
  Filled 2015-03-19 (×5): qty 1

## 2015-03-19 MED ORDER — KETOROLAC TROMETHAMINE 30 MG/ML IJ SOLN: 30.0000 mg | Freq: Once | INTRAMUSCULAR | Status: DC

## 2015-03-19 MED ORDER — MEPERIDINE HCL 25 MG/ML IJ SOLN: 6.2500 mg | INTRAMUSCULAR | Status: DC | PRN

## 2015-03-19 MED ORDER — HYDROCHLOROTHIAZIDE 25 MG PO TABS
25.0000 mg | ORAL_TABLET | Freq: Every day | ORAL | Status: DC
  Administered 2015-03-20 – 2015-03-21 (×2): 25 mg via ORAL
  Filled 2015-03-19 (×2): qty 1

## 2015-03-19 MED ORDER — ONDANSETRON HCL 4 MG/2ML IJ SOLN: 4.0000 mg | Freq: Four times a day (QID) | INTRAMUSCULAR | Status: DC | PRN

## 2015-03-19 MED ORDER — DEXAMETHASONE SODIUM PHOSPHATE 10 MG/ML IJ SOLN
INTRAMUSCULAR | Status: DC | PRN
  Administered 2015-03-19: 4 mg via INTRAVENOUS

## 2015-03-19 MED ORDER — NALOXONE HCL 0.4 MG/ML IJ SOLN: 0.4000 mg | INTRAMUSCULAR | Status: DC | PRN

## 2015-03-19 MED ORDER — ONDANSETRON HCL 4 MG/2ML IJ SOLN
INTRAMUSCULAR | Status: DC | PRN
  Administered 2015-03-19: 4 mg via INTRAVENOUS

## 2015-03-19 MED ORDER — LACTATED RINGERS IV SOLN
INTRAVENOUS | Status: DC
  Administered 2015-03-19 (×2): via INTRAVENOUS

## 2015-03-19 MED ORDER — KETOROLAC TROMETHAMINE 30 MG/ML IJ SOLN
30.0000 mg | Freq: Four times a day (QID) | INTRAMUSCULAR | Status: DC
  Administered 2015-03-19 – 2015-03-20 (×3): 30 mg via INTRAVENOUS
  Filled 2015-03-19 (×5): qty 1

## 2015-03-19 MED ORDER — DEXAMETHASONE SODIUM PHOSPHATE 4 MG/ML IJ SOLN
INTRAMUSCULAR | Status: AC
  Filled 2015-03-19: qty 1

## 2015-03-19 MED ORDER — GLYCOPYRROLATE 0.2 MG/ML IJ SOLN
INTRAMUSCULAR | Status: AC
  Filled 2015-03-19: qty 3

## 2015-03-19 MED ORDER — ONDANSETRON HCL 4 MG PO TABS: 4.0000 mg | ORAL_TABLET | Freq: Four times a day (QID) | ORAL | Status: DC | PRN

## 2015-03-19 MED ORDER — LIDOCAINE HCL (CARDIAC) 20 MG/ML IV SOLN
INTRAVENOUS | Status: AC
  Filled 2015-03-19: qty 5

## 2015-03-19 MED ORDER — KETOROLAC TROMETHAMINE 30 MG/ML IJ SOLN
INTRAMUSCULAR | Status: DC | PRN
  Administered 2015-03-19: 30 mg via INTRAVENOUS

## 2015-03-19 MED ORDER — KETOROLAC TROMETHAMINE 30 MG/ML IJ SOLN
INTRAMUSCULAR | Status: AC
  Filled 2015-03-19: qty 1

## 2015-03-19 MED ORDER — CEFAZOLIN SODIUM-DEXTROSE 2-3 GM-% IV SOLR
2.0000 g | INTRAVENOUS | Status: AC
  Administered 2015-03-19: 2 g via INTRAVENOUS

## 2015-03-19 MED ORDER — ACETAMINOPHEN 10 MG/ML IV SOLN
1000.0000 mg | Freq: Once | INTRAVENOUS | Status: AC
  Administered 2015-03-19: 1000 mg via INTRAVENOUS
  Filled 2015-03-19: qty 100

## 2015-03-19 MED ORDER — HYDROMORPHONE 0.3 MG/ML IV SOLN
INTRAVENOUS | Status: DC
  Administered 2015-03-19: 13:00:00 via INTRAVENOUS
  Filled 2015-03-19: qty 25

## 2015-03-19 SURGICAL SUPPLY — 32 items
BENZOIN TINCTURE PRP APPL 2/3 (GAUZE/BANDAGES/DRESSINGS) ×4 IMPLANT
CANISTER SUCT 3000ML (MISCELLANEOUS) ×4 IMPLANT
CLOSURE WOUND 1/2 X4 (GAUZE/BANDAGES/DRESSINGS) ×1
CLOTH BEACON ORANGE TIMEOUT ST (SAFETY) ×4 IMPLANT
CONT PATH 16OZ SNAP LID 3702 (MISCELLANEOUS) ×4 IMPLANT
DRAPE WARM FLUID 44X44 (DRAPE) ×4 IMPLANT
DRSG OPSITE POSTOP 4X10 (GAUZE/BANDAGES/DRESSINGS) ×4 IMPLANT
DURAPREP 26ML APPLICATOR (WOUND CARE) ×4 IMPLANT
GLOVE BIO SURGEON STRL SZ8 (GLOVE) ×4 IMPLANT
GLOVE BIOGEL PI IND STRL 7.0 (GLOVE) ×4 IMPLANT
GLOVE BIOGEL PI INDICATOR 7.0 (GLOVE) ×4
GLOVE ORTHO TXT STRL SZ7.5 (GLOVE) ×4 IMPLANT
GOWN STRL REUS W/TWL LRG LVL3 (GOWN DISPOSABLE) ×8 IMPLANT
NS IRRIG 1000ML POUR BTL (IV SOLUTION) ×4 IMPLANT
PACK ABDOMINAL GYN (CUSTOM PROCEDURE TRAY) ×4 IMPLANT
PAD OB MATERNITY 4.3X12.25 (PERSONAL CARE ITEMS) ×4 IMPLANT
PROTECTOR NERVE ULNAR (MISCELLANEOUS) ×4 IMPLANT
RETRACTOR WND ALEXIS 25 LRG (MISCELLANEOUS) ×2 IMPLANT
RTRCTR WOUND ALEXIS 25CM LRG (MISCELLANEOUS) ×4
SHEARS FOC LG CVD HARMONIC 17C (MISCELLANEOUS) ×4 IMPLANT
STRIP CLOSURE SKIN 1/2X4 (GAUZE/BANDAGES/DRESSINGS) ×3 IMPLANT
SUT CHROMIC 1MO 4 18 CR8 (SUTURE) ×8 IMPLANT
SUT PLAIN 2 0 (SUTURE) ×2
SUT PLAIN ABS 2-0 CT1 27XMFL (SUTURE) ×2 IMPLANT
SUT VIC AB 0 CT1 27 (SUTURE) ×4
SUT VIC AB 0 CT1 27XBRD ANBCTR (SUTURE) ×4 IMPLANT
SUT VIC AB 3-0 SH 27 (SUTURE) ×2
SUT VIC AB 3-0 SH 27X BRD (SUTURE) ×2 IMPLANT
SUT VICRYL 0 TIES 12 18 (SUTURE) ×4 IMPLANT
TOWEL OR 17X24 6PK STRL BLUE (TOWEL DISPOSABLE) ×8 IMPLANT
TRAY FOLEY CATH SILVER 14FR (SET/KITS/TRAYS/PACK) ×4 IMPLANT
WATER STERILE IRR 1000ML POUR (IV SOLUTION) ×4 IMPLANT

## 2015-03-19 NOTE — Transfer of Care (Signed)
Immediate Anesthesia Transfer of Care Note  Patient: Diane Anthony  Procedure(s) Performed: Procedure(s): HYSTERECTOMY ABDOMINAL (N/A) BILATERAL SALPINGECTOMY (Bilateral)  Patient Location: PACU  Anesthesia Type:General  Level of Consciousness: awake, sedated and patient cooperative  Airway & Oxygen Therapy: Patient Spontanous Breathing and Patient connected to nasal cannula oxygen  Post-op Assessment: Report given to RN and Post -op Vital signs reviewed and stable  Post vital signs: Reviewed and stable  Last Vitals:  Filed Vitals:   03/19/15 0636  BP: 141/69  Pulse: 65  Temp: 36.6 C  Resp: 18    Complications: No apparent anesthesia complications

## 2015-03-19 NOTE — Anesthesia Postprocedure Evaluation (Signed)
  Anesthesia Post-op Note  Patient: Diane Anthony  Procedure(s) Performed: Procedure(s): HYSTERECTOMY ABDOMINAL (N/A) BILATERAL SALPINGECTOMY (Bilateral)  Patient Location: Women's Unit  Anesthesia Type:General  Level of Consciousness: awake, alert , oriented and patient cooperative  Airway and Oxygen Therapy: Patient Spontanous Breathing  Post-op Pain: none  Post-op Assessment: Post-op Vital signs reviewed, Patient's Cardiovascular Status Stable, Respiratory Function Stable, Patent Airway and No signs of Nausea or vomiting  Post-op Vital Signs: Reviewed and stable  Last Vitals:  Filed Vitals:   03/19/15 1645  BP: 138/76  Pulse: 64  Temp:   Resp: 14    Complications: No apparent anesthesia complications

## 2015-03-19 NOTE — Anesthesia Postprocedure Evaluation (Signed)
  Anesthesia Post-op Note  Patient: Diane Anthony  Procedure(s) Performed: Procedure(s): HYSTERECTOMY ABDOMINAL (N/A) BILATERAL SALPINGECTOMY (Bilateral)  Patient Location: PACU  Anesthesia Type:General  Level of Consciousness: awake and alert   Airway and Oxygen Therapy: Patient Spontanous Breathing and Patient connected to nasal cannula oxygen  Post-op Pain: mild  Post-op Assessment: Post-op Vital signs reviewed, Patient's Cardiovascular Status Stable, Respiratory Function Stable, Patent Airway and No signs of Nausea or vomiting  Post-op Vital Signs: Reviewed  Last Vitals:  Filed Vitals:   03/19/15 1030  BP: 134/72  Pulse: 60  Temp: 36.8 C  Resp: 14    Complications: No apparent anesthesia complications

## 2015-03-19 NOTE — Addendum Note (Signed)
Addendum  created 03/19/15 1724 by Raenette Rover, CRNA   Modules edited: Notes Section   Notes Section:  File: [id number]

## 2015-03-19 NOTE — Progress Notes (Signed)
Patient ID: Diane Anthony, female   DOB: 08-10-1961, 54 y.o.   MRN: [id number] DOS Pt is awake and alert and has no complaints Urine output is good and the urine is yellow and clear.

## 2015-03-19 NOTE — Op Note (Signed)
Preoperative diagnosis: Symptomatic myomatous uterus Postoperative diagnosis: Same Procedure: Total abdominal hysterectomy, bilateral salpingectomy Surgeon: Cheri Fowler M.D. Assistant: Paula Compton, MD Anesthesia: GETA Findings: She had a significantly enlarged uterus with myomas, tubes and ovaries were normal. Estimated blood loss: 364WO Complications: None  Procedure in detail:  The patient was taken to the operating room and placed in the dorsosupine position. General anesthesia was induced.  Abdomen perineum and vagina were prepped and draped in the usual sterile fashion and a Foley catheter was placed. Her abdomen was then entered via a standard Pfannenstiel incision. An Alexis self-retaining retractor was placed after the uterus was delivered through the incision, and bowels were packed out of the pelvis.  Uterine cornu were grasped with large Kelly clamps. Both fallopian tubes were freed from the ovary and mesosalpinx taken down with Bovie.  Round ligaments were taken down with electrocautery. The handheld Harmonic scalpel device was then used to take down the round ligaments, utero-ovarian ligaments, broad ligaments, and across the anterior peritoneum.  I started to come across uterine arteries and cardinal ligaments with the Harmonic, but the vessels were large so they were clamped, transected and sutured with #1 Chromic. Zeppelin clamps were then used to take pedicles along the cervix, the vaginal angles were taken down with harmonic scalpel on each side and the uterus and cervix with were removed. 2 figure 8 sutures of #1 chromic were then used to close the rest of the vaginal cuff. All pedicles were inspected and found to be hemostatic. The pelvis was irrigated and found to be hemostatic. Some bleeding from the right ovarian pedicle was controlled with 3-0 Vicryl.  Uterosacral ligaments were plicated in the midline with #1 Chromic.  The lap sponges were removed from the abdomen and the  Alexis retractor was removed.  Fascia was closed in a running fashion starting at both ends and meeting in the middle with 0 Vicryl after hemostasis was achieved with Bovie. Subcutaneous tissue was irrigated and made hemostatic with Bovie. Subcutaneous tissue was then closed with running 2-0 plain gut suture. Skins incision was then closed running subcuticular 4-0 Vicryl followed by sterile dressing. Patient tolerated procedure well and was taken to the PACU in stable condition. Counts were correct, she had PAS hose on throughout the procedure, she received Ancef 2 gms IV.

## 2015-03-19 NOTE — Interval H&P Note (Signed)
History and Physical Interval Note:  03/19/2015 8:03 AM  Diane Anthony  has presented today for surgery, with the diagnosis of Symptomatic Fibroids  The various methods of treatment have been discussed with the patient and family. After consideration of risks, benefits and other options for treatment, the patient has consented to  Procedure(s): HYSTERECTOMY ABDOMINAL (N/A) BILATERAL SALPINGECTOMY (Bilateral) as a surgical intervention .  The patient's history has been reviewed, patient examined, no change in status, stable for surgery.  I have reviewed the patient's chart and labs.  Questions were answered to the patient's satisfaction.     Demetrious Rainford D

## 2015-03-19 NOTE — Anesthesia Procedure Notes (Signed)
Procedure Name: Intubation Date/Time: 03/19/2015 8:34 AM Performed by: Tobin Chad Pre-anesthesia Checklist: Emergency Drugs available, Timeout performed, Patient identified, Patient being monitored and Suction available Patient Re-evaluated:Patient Re-evaluated prior to inductionOxygen Delivery Method: Circle system utilized and Simple face mask Intubation Type: IV induction Ventilation: Mask ventilation without difficulty Laryngoscope Size: Mac and 3 Grade View: Grade II Tube size: 7.0 mm Number of attempts: 2 (Esophageal intubation recognized. Intubated then by Tresa Aleiah Mohammed, MD ) Placement Confirmation: ETT inserted through vocal cords under direct vision,  breath sounds checked- equal and bilateral and positive ETCO2 Secured at: 22 (com) cm Tube secured with: Tape Dental Injury: Teeth and Oropharynx as per pre-operative assessment  Difficulty Due To: Difficult Airway- due to anterior larynx

## 2015-03-20 ENCOUNTER — Encounter: Payer: BLUE CROSS/BLUE SHIELD | Admitting: Physical Therapy

## 2015-03-20 ENCOUNTER — Encounter (HOSPITAL_COMMUNITY): Payer: Self-pay | Admitting: Obstetrics and Gynecology

## 2015-03-20 DIAGNOSIS — Z90710 Acquired absence of both cervix and uterus: Secondary | ICD-10-CM | POA: Diagnosis present

## 2015-03-20 DIAGNOSIS — K219 Gastro-esophageal reflux disease without esophagitis: Secondary | ICD-10-CM | POA: Diagnosis present

## 2015-03-20 DIAGNOSIS — F329 Major depressive disorder, single episode, unspecified: Secondary | ICD-10-CM | POA: Diagnosis present

## 2015-03-20 DIAGNOSIS — D259 Leiomyoma of uterus, unspecified: Secondary | ICD-10-CM | POA: Diagnosis present

## 2015-03-20 DIAGNOSIS — E785 Hyperlipidemia, unspecified: Secondary | ICD-10-CM | POA: Diagnosis present

## 2015-03-20 DIAGNOSIS — Z886 Allergy status to analgesic agent status: Secondary | ICD-10-CM | POA: Diagnosis not present

## 2015-03-20 DIAGNOSIS — I10 Essential (primary) hypertension: Secondary | ICD-10-CM | POA: Diagnosis present

## 2015-03-20 LAB — CBC
HCT: 24.2 % — ABNORMAL LOW (ref 36.0–46.0)
Hemoglobin: 7.8 g/dL — ABNORMAL LOW (ref 12.0–15.0)
MCH: 26.1 pg (ref 26.0–34.0)
MCHC: 32.2 g/dL (ref 30.0–36.0)
MCV: 80.9 fL (ref 78.0–100.0)
Platelets: 269 10*3/uL (ref 150–400)
RBC: 2.99 MIL/uL — AB (ref 3.87–5.11)
RDW: 16.7 % — ABNORMAL HIGH (ref 11.5–15.5)
WBC: 7.4 10*3/uL (ref 4.0–10.5)

## 2015-03-20 MED ORDER — IBUPROFEN 600 MG PO TABS
600.0000 mg | ORAL_TABLET | Freq: Four times a day (QID) | ORAL | Status: DC | PRN
  Administered 2015-03-20 – 2015-03-21 (×4): 600 mg via ORAL
  Filled 2015-03-20 (×4): qty 1

## 2015-03-20 NOTE — Progress Notes (Signed)
1 Day Post-Op Procedure(s) (LRB): HYSTERECTOMY ABDOMINAL (N/A) BILATERAL SALPINGECTOMY (Bilateral)  Subjective: Patient reports incisional pain and tolerating PO.    Objective: I have reviewed patient's vital signs, intake and output and labs.  General: alert GI: soft, dressing C/D/I  Assessment: s/p Procedure(s): HYSTERECTOMY ABDOMINAL (N/A) BILATERAL SALPINGECTOMY (Bilateral): stable and progressing well  Plan: Advance diet Encourage ambulation Advance to PO medication Discontinue IV fluids  LOS: 1 day    Diane Anthony D 03/20/2015, 8:24 AM

## 2015-03-20 NOTE — Progress Notes (Signed)
Ur chart review completed.  

## 2015-03-21 MED ORDER — OXYCODONE-ACETAMINOPHEN 5-325 MG PO TABS: 1.0000 | ORAL_TABLET | ORAL | Status: DC | PRN

## 2015-03-21 NOTE — Discharge Instructions (Signed)
Routine instructions for hysterectomy °

## 2015-03-21 NOTE — Progress Notes (Signed)
Pt discharged home with daughter and grandson... Discharge instructions reviewed with pt and she verbalized understanding... Condition stable... No equipment... Taken to car via wheelchair by Janalyn Shy, RN.

## 2015-03-21 NOTE — Progress Notes (Signed)
2 Days Post-Op Procedure(s) (LRB): HYSTERECTOMY ABDOMINAL (N/A) BILATERAL SALPINGECTOMY (Bilateral)  Subjective: Patient reports incisional pain, tolerating PO and no problems voiding.    Objective: I have reviewed patient's vital signs, intake and output and pathology.  General: alert GI: soft, incision appears intact,   Assessment: s/p Procedure(s): HYSTERECTOMY ABDOMINAL (N/A) BILATERAL SALPINGECTOMY (Bilateral): stable and progressing well  Plan: Discharge home  LOS: 2 days    Diane Anthony D 03/21/2015, 8:28 AM

## 2015-03-21 NOTE — Discharge Summary (Signed)
Physician Discharge Summary  Patient ID: Diane Anthony MRN: [id number] DOB/AGE: 53-Mar-1962 53 y.o.  Admit date: 03/19/2015 Discharge date: 03/21/2015  Admission Diagnoses:  Symptomatic myomatous uterus  Discharge Diagnoses:  Same Active Problems:   S/P abdominal hysterectomy   S/P TAH (total abdominal hysterectomy)   Discharged Condition: good  Hospital Course: Pt admitted for TAH/bilateral salpingectomy, 350cc EBL.  No post-op problems, rapidly able to ambulate and tolerate diet.  Discharge Exam: Blood pressure 105/59, pulse 58, temperature 99.6 F (37.6 C), temperature source Oral, resp. rate 15, height 5' (1.524 m), weight 64.864 kg (143 lb), last menstrual period 03/06/2015, SpO2 99 %. General appearance: alert  Disposition: 01-Home or Self Care  Discharge Instructions    Diet - low sodium heart healthy    Complete by:  As directed      Increase activity slowly    Complete by:  As directed      Lifting restrictions    Complete by:  As directed   10 lbs     Sexual Activity Restrictions    Complete by:  As directed   Pelvic rest            Medication List    TAKE these medications        calcium carbonate 600 MG Tabs tablet  Commonly known as:  OS-CAL  Take 600 mg by mouth daily with breakfast.     esomeprazole 20 MG capsule  Commonly known as:  NEXIUM  Take 20 mg by mouth as needed (heartburn).     FISH OIL PO  Take 1 capsule by mouth daily.     hydrochlorothiazide 25 MG tablet  Commonly known as:  HYDRODIURIL  Take 25 mg by mouth daily.     naproxen 250 MG tablet  Commonly known as:  NAPROSYN  Take 1 tablet (250 mg total) by mouth 2 (two) times daily with a meal.     oxyCODONE-acetaminophen 5-325 MG per tablet  Commonly known as:  PERCOCET/ROXICET  Take 1-2 tablets by mouth every 4 (four) hours as needed for severe pain (moderate to severe pain (when tolerating fluids)).     simvastatin 40 MG tablet  Commonly known as:  ZOCOR  Take 40 mg by  mouth daily.           Follow-up Information    Follow up with Juliyah Mergen D, MD. Schedule an appointment as soon as possible for a visit in 2 weeks.   Specialty:  Obstetrics and Gynecology   Contact information:   [address], [place] [address] [phone]       Signed: Clarene Duke 03/21/2015, 8:31 AM

## 2015-11-22 ENCOUNTER — Encounter (HOSPITAL_COMMUNITY): Payer: Self-pay | Admitting: *Deleted

## 2015-11-22 ENCOUNTER — Emergency Department (HOSPITAL_COMMUNITY)
Admission: EM | Admit: 2015-11-22 | Discharge: 2015-11-22 | Disposition: A | Discharge status: Home / Self Care | Payer: BLUE CROSS/BLUE SHIELD | Attending: Emergency Medicine | Admitting: Emergency Medicine

## 2015-11-22 DIAGNOSIS — N898 Other specified noninflammatory disorders of vagina: Secondary | ICD-10-CM | POA: Insufficient documentation

## 2015-11-22 DIAGNOSIS — I10 Essential (primary) hypertension: Secondary | ICD-10-CM | POA: Diagnosis not present

## 2015-11-22 DIAGNOSIS — Z79899 Other long term (current) drug therapy: Secondary | ICD-10-CM | POA: Diagnosis not present

## 2015-11-22 DIAGNOSIS — E785 Hyperlipidemia, unspecified: Secondary | ICD-10-CM | POA: Insufficient documentation

## 2015-11-22 DIAGNOSIS — Z3202 Encounter for pregnancy test, result negative: Secondary | ICD-10-CM | POA: Insufficient documentation

## 2015-11-22 DIAGNOSIS — K219 Gastro-esophageal reflux disease without esophagitis: Secondary | ICD-10-CM | POA: Insufficient documentation

## 2015-11-22 DIAGNOSIS — Z8659 Personal history of other mental and behavioral disorders: Secondary | ICD-10-CM | POA: Insufficient documentation

## 2015-11-22 LAB — URINALYSIS, ROUTINE W REFLEX MICROSCOPIC
Bilirubin Urine: NEGATIVE
Glucose, UA: NEGATIVE mg/dL
KETONES UR: NEGATIVE mg/dL
NITRITE: NEGATIVE
Protein, ur: NEGATIVE mg/dL
SPECIFIC GRAVITY, URINE: 1.02 (ref 1.005–1.030)
pH: 6 (ref 5.0–8.0)

## 2015-11-22 LAB — URINE MICROSCOPIC-ADD ON: BACTERIA UA: NONE SEEN

## 2015-11-22 LAB — WET PREP, GENITAL
CLUE CELLS WET PREP: NONE SEEN
Sperm: NONE SEEN
TRICH WET PREP: NONE SEEN
YEAST WET PREP: NONE SEEN

## 2015-11-22 LAB — PREGNANCY, URINE: Preg Test, Ur: NEGATIVE

## 2015-11-22 NOTE — ED Notes (Signed)
Pt states she has had white discharge and has been seen by her MD and given medication for yeast infection but states it continues to itch and burn.  Pt states she feels crampy when urinating.  Pt states she has had this issue since having a hysterectomy in March.

## 2015-11-22 NOTE — ED Provider Notes (Signed)
CSN: [id number]     Arrival date & time 11/22/15  [id number] History   First MD Initiated Contact with Patient 11/22/15 1011     Chief Complaint  Patient presents with  . Vaginal Discharge      HPI Patient presents to the emergency department complaining of scant vaginal discharge over the past week.  She was initially given medication by her doctor for possible yeast infection but she continues having a mild yellowish discharge.  She has had a total hysterectomy.  She has no new sexual partners.  She is a monogamous relationship.  She denies abdominal pain.  No fevers or chills.  No other complaints.   Past Medical History  Diagnosis Date  . Hypertension   . Hyperlipidemia   . GERD (gastroesophageal reflux disease)   . SVD (spontaneous vaginal delivery)     x 2  . Depression     no meds   Past Surgical History  Procedure Laterality Date  . Tonsillectomy    . Colonoscopy    . Upper gi endoscopy    . Abdominal hysterectomy N/A 03/19/2015    Procedure: HYSTERECTOMY ABDOMINAL;  Surgeon: Cheri Fowler, MD;  Location: [facility];  Service: Gynecology;  Laterality: N/A;  . Bilateral salpingectomy Bilateral 03/19/2015    Procedure: BILATERAL SALPINGECTOMY;  Surgeon: Cheri Fowler, MD;  Location: [facility];  Service: Gynecology;  Laterality: Bilateral;   History reviewed. No pertinent family history. Social History  Substance Use Topics  . Smoking status: Never Smoker   . Smokeless tobacco: Never Used  . Alcohol Use: No   OB History    No data available     Review of Systems  All other systems reviewed and are negative.     Allergies  Aspirin  Home Medications   Prior to Admission medications   Medication Sig Start Date End Date Taking? Authorizing Provider  esomeprazole (NEXIUM) 20 MG capsule Take 20 mg by mouth as needed (heartburn).    Yes Historical Provider, MD  hydrochlorothiazide (HYDRODIURIL) 25 MG tablet Take 25 mg by mouth daily.   Yes Historical Provider, MD  Omega-3  Fatty Acids (FISH OIL PO) Take 1 capsule by mouth daily.   Yes Historical Provider, MD  simvastatin (ZOCOR) 40 MG tablet Take 40 mg by mouth daily.   Yes Historical Provider, MD  naproxen (NAPROSYN) 250 MG tablet Take 1 tablet (250 mg total) by mouth 2 (two) times daily with a meal. Patient not taking: Reported on 11/22/2015 03/14/15   Waynetta Pean, PA-C  oxyCODONE-acetaminophen (PERCOCET/ROXICET) 5-325 MG per tablet Take 1-2 tablets by mouth every 4 (four) hours as needed for severe pain (moderate to severe pain (when tolerating fluids)). 03/21/15   Cheri Fowler, MD   BP 139/84 mmHg  Pulse 62  Temp(Src) 98.4 F (36.9 C) (Oral)  Resp 18  Ht 5' (1.524 m)  Wt 142 lb (64.411 kg)  BMI 27.73 kg/m2  SpO2 100%  LMP 02/25/2015 Physical Exam  Constitutional: She is oriented to person, place, and time. She appears well-developed and well-nourished.  HENT:  Head: Normocephalic.  Eyes: EOM are normal.  Neck: Normal range of motion.  Pulmonary/Chest: Effort normal.  Abdominal: She exhibits no distension. There is no tenderness.  Genitourinary:  Normal external genitalia.  Vaginal vault without obvious abnormalities except for a small amount of scant yellow-white discharge.  No vaginal masses noted.  Musculoskeletal: Normal range of motion.  Neurological: She is alert and oriented to person, place, and time.  Psychiatric: She has  a normal mood and affect.  Nursing note and vitals reviewed.   ED Course  Procedures (including critical care time) Labs Review Labs Reviewed - No data to display  Imaging Review No results found. I have personally reviewed and evaluated these images and lab results as part of my medical decision-making.   EKG Interpretation None      MDM   Final diagnoses:  None    Wet prep without abnormality here in the emergency department.  Patient be referred back to her primary care physician and gynecologist for ongoing follow-up of her vaginal  discharge.    Jola Schmidt, MD 11/22/15 1336

## 2015-11-22 NOTE — ED Notes (Signed)
Pelvic at bedside   

## 2015-11-22 NOTE — ED Notes (Signed)
MD at bedside. 

## 2015-11-23 LAB — RPR: RPR Ser Ql: NONREACTIVE

## 2016-03-31 DIAGNOSIS — N898 Other specified noninflammatory disorders of vagina: Secondary | ICD-10-CM | POA: Diagnosis not present

## 2016-03-31 DIAGNOSIS — R300 Dysuria: Secondary | ICD-10-CM | POA: Diagnosis not present

## 2016-03-31 DIAGNOSIS — R319 Hematuria, unspecified: Secondary | ICD-10-CM | POA: Diagnosis not present

## 2016-04-01 DIAGNOSIS — N898 Other specified noninflammatory disorders of vagina: Secondary | ICD-10-CM | POA: Diagnosis not present

## 2016-04-09 ENCOUNTER — Other Ambulatory Visit: Payer: Self-pay

## 2016-04-09 DIAGNOSIS — Z1231 Encounter for screening mammogram for malignant neoplasm of breast: Secondary | ICD-10-CM

## 2016-04-29 ENCOUNTER — Ambulatory Visit: Payer: Self-pay

## 2016-05-05 DIAGNOSIS — Z130 Encounter for screening for diseases of the blood and blood-forming organs and certain disorders involving the immune mechanism: Secondary | ICD-10-CM | POA: Diagnosis not present

## 2016-05-05 DIAGNOSIS — R3121 Asymptomatic microscopic hematuria: Secondary | ICD-10-CM | POA: Diagnosis not present

## 2016-05-05 DIAGNOSIS — Z1231 Encounter for screening mammogram for malignant neoplasm of breast: Secondary | ICD-10-CM | POA: Diagnosis not present

## 2016-05-05 DIAGNOSIS — Z1389 Encounter for screening for other disorder: Secondary | ICD-10-CM | POA: Diagnosis not present

## 2016-05-05 DIAGNOSIS — Z6829 Body mass index (BMI) 29.0-29.9, adult: Secondary | ICD-10-CM | POA: Diagnosis not present

## 2016-05-05 DIAGNOSIS — Z01419 Encounter for gynecological examination (general) (routine) without abnormal findings: Secondary | ICD-10-CM | POA: Diagnosis not present

## 2016-05-26 DIAGNOSIS — R3121 Asymptomatic microscopic hematuria: Secondary | ICD-10-CM | POA: Diagnosis not present

## 2016-06-19 ENCOUNTER — Other Ambulatory Visit: Payer: Self-pay | Admitting: Family Medicine

## 2016-06-19 DIAGNOSIS — Z1231 Encounter for screening mammogram for malignant neoplasm of breast: Secondary | ICD-10-CM

## 2016-07-29 DIAGNOSIS — E785 Hyperlipidemia, unspecified: Secondary | ICD-10-CM | POA: Diagnosis not present

## 2016-07-29 DIAGNOSIS — I10 Essential (primary) hypertension: Secondary | ICD-10-CM | POA: Diagnosis not present

## 2016-07-29 DIAGNOSIS — R8290 Unspecified abnormal findings in urine: Secondary | ICD-10-CM | POA: Diagnosis not present

## 2016-11-12 DIAGNOSIS — E785 Hyperlipidemia, unspecified: Secondary | ICD-10-CM | POA: Diagnosis not present

## 2016-11-12 DIAGNOSIS — Z0000 Encounter for general adult medical examination without abnormal findings: Secondary | ICD-10-CM | POA: Diagnosis not present

## 2016-11-12 DIAGNOSIS — I10 Essential (primary) hypertension: Secondary | ICD-10-CM | POA: Diagnosis not present

## 2017-01-06 DIAGNOSIS — R3129 Other microscopic hematuria: Secondary | ICD-10-CM | POA: Diagnosis not present

## 2017-01-31 ENCOUNTER — Ambulatory Visit
Admission: RE | Admit: 2017-01-31 | Discharge: 2017-01-31 | Disposition: A | Discharge status: Home / Self Care | Payer: BLUE CROSS/BLUE SHIELD | Source: Ambulatory Visit | Attending: Family Medicine | Admitting: Family Medicine

## 2017-01-31 ENCOUNTER — Other Ambulatory Visit: Payer: Self-pay | Admitting: Family Medicine

## 2017-01-31 ENCOUNTER — Inpatient Hospital Stay: Admission: RE | Admit: 2017-01-31 | Payer: BLUE CROSS/BLUE SHIELD | Source: Ambulatory Visit

## 2017-01-31 DIAGNOSIS — Z1231 Encounter for screening mammogram for malignant neoplasm of breast: Secondary | ICD-10-CM

## 2017-02-17 DIAGNOSIS — R3121 Asymptomatic microscopic hematuria: Secondary | ICD-10-CM | POA: Diagnosis not present

## 2017-03-01 DIAGNOSIS — R3121 Asymptomatic microscopic hematuria: Secondary | ICD-10-CM | POA: Diagnosis not present

## 2017-03-01 DIAGNOSIS — R3129 Other microscopic hematuria: Secondary | ICD-10-CM | POA: Diagnosis not present

## 2017-03-03 DIAGNOSIS — R918 Other nonspecific abnormal finding of lung field: Secondary | ICD-10-CM | POA: Diagnosis not present

## 2017-03-23 DIAGNOSIS — R3121 Asymptomatic microscopic hematuria: Secondary | ICD-10-CM | POA: Diagnosis not present

## 2017-03-24 ENCOUNTER — Other Ambulatory Visit: Payer: Self-pay | Admitting: Urology

## 2017-03-29 ENCOUNTER — Encounter (HOSPITAL_BASED_OUTPATIENT_CLINIC_OR_DEPARTMENT_OTHER): Payer: Self-pay | Admitting: *Deleted

## 2017-03-29 NOTE — Progress Notes (Signed)
NPO AFTER MN.  ARRIVE AT 1015.  NEEDS ISTAT 8 AND EKG.  WILL TAKE NEXIUM AM DOS W/ SIPS OF WATER.

## 2017-04-06 NOTE — H&P (Signed)
CC/HPI: I have blood in my urine.     Mr. Schou returns today in f/u from a CT for microhematuria. She was found to have a possible lesion in the right mid/prox ureter. She has no flank pain or gross hematuria. She had a pulmonary nodule as well and her PCP is going to manage that.     ALLERGIES: Aspirin TABS - Other Reaction, GI upset    MEDICATIONS: Biotin  Calcium Citrate + D TABS Oral  Hydrochlorothiazide 25 mg tablet Oral  Multi-Enzyme Oral Tablet Oral  Omega-3 180 mg-270 mg capsule Oral  Omeprazole 20 mg tablet, delayed release Oral  Simvastatin 20 mg tablet Oral  Sleep Aid 25 mg tablet Oral     GU PSH: Hysterectomy - about 2015 Locm 300-399Mg /Ml Iodine,1Ml - 03/01/2017    NON-GU PSH: Lesion Remove Colonoscopy - 2010    GU PMH: Asymptomatic microscopic hematuria (Chronic), she has <2 RBC's on micro and doesn't meet evaluation criteria. - 05/26/2016 Other microscopic hematuria, Microscopic hematuria - 2014 Personal Hx Oth Urinary System diseases, History of hematuria - 2014      PMH Notes:  2009-02-17 15:45:01 - Note: Peptic Ulcer   NON-GU PMH: Personal history of other diseases of the circulatory system, History of hypertension - 2014 Personal history of other diseases of the digestive system, History of esophageal reflux - 2014 Personal history of other endocrine, nutritional and metabolic disease, History of hypercholesterolemia - 2014 Encounter for general adult medical examination without abnormal findings, Encounter for preventive health examination GERD Hypercholesterolemia Hypertension    FAMILY HISTORY: Alzheimer's Disease - Father Death In The Family Father - Father Family Health Status - Mother's Age - Mother Family Health Status Number - Runs In Family Hypertension - Runs In Family Primary cancer of lesser curve of stomach - Brother renal failure - Brother, Father   SOCIAL HISTORY: Marital Status: Married Current Smoking Status: Patient has  never smoked.  Does not use smokeless tobacco. Drinks 2 drinks per week.  Does not use drugs. Drinks 3 caffeinated drinks per day. Patient's occupation Loss adjuster, chart[employer].     Notes:  1 son, 1 daughter    REVIEW OF SYSTEMS:    GU Review Female:   Patient denies frequent urination, hard to postpone urination, burning /pain with urination, get up at night to urinate, leakage of urine, stream starts and stops, trouble starting your stream, have to strain to urinate, and currently pregnant.  Gastrointestinal (Upper):   Patient denies nausea, vomiting, and indigestion/ heartburn.  Gastrointestinal (Lower):   Patient denies diarrhea and constipation.  Constitutional:   Patient denies fever, night sweats, weight loss, and fatigue.  Skin:   Patient denies skin rash/ lesion and itching.  Eyes:   Patient denies blurred vision and double vision.  Ears/ Nose/ Throat:   Patient denies sore throat and sinus problems.  Hematologic/Lymphatic:   Patient denies swollen glands and easy bruising.  Cardiovascular:   Patient denies leg swelling and chest pains.  Respiratory:   Patient denies cough and shortness of breath.  Endocrine:   Patient denies excessive thirst.  Musculoskeletal:   Patient denies back pain and joint pain.  Neurological:   Patient denies headaches and dizziness.  Psychologic:   Patient denies depression and anxiety.   VITAL SIGNS:      03/23/2017 03:55 PM  BP 135/80 mmHg  Pulse 90 /min  Temperature 98.3 F / 37 C   MULTI-SYSTEM PHYSICAL EXAMINATION:    Constitutional: Well-nourished. No physical deformities. Normally developed.  Good grooming.  Respiratory: No labored breathing, no use of accessory muscles.   Cardiovascular: Normal temperature,rrr without murmur.     PAST DATA REVIEWED:  Source Of History:  Patient  Lab Test Review:   Urine Cytology  Urine Test Review:   Urinalysis  X-Ray Review: C.T. Hematuria: Reviewed Films. Reviewed Report. Discussed With Patient.      PROCEDURES:          Urinalysis w/Scope Dipstick Dipstick Cont'd Micro  Color: Yellow Bilirubin: Neg WBC/hpf: NS (Not Seen)  Appearance: Cloudy Ketones: Neg RBC/hpf: 3 - 10/hpf  Specific Gravity: 1.025 Blood: 2+ Bacteria: NS (Not Seen)  pH: 6.5 Protein: Neg Cystals: NS (Not Seen)  Glucose: Neg Urobilinogen: 0.2 Casts: NS (Not Seen)    Nitrites: Neg Trichomonas: Not Present    Leukocyte Esterase: Neg Mucous: Not Present      Epithelial Cells: 0 - 5/hpf      Yeast: NS (Not Seen)      Sperm: Not Present    ASSESSMENT:      ICD-10 Details  1 GU:   Asymptomatic microscopic hematuria - R31.21 Stable - She has a possible lesion in the right mid ureter. I am going to get her set up for cystoscopy with right retrograde pyelogram, possible ureteroscopy with biopsy and stent. I have reviewed the risks of the procedure including bleeding, infection, ureteral injury, need for a stent and secondary procedures as well as thrombotic events and anesthetic complications.    PLAN:           Schedule Return Visit/Planned Activity: Next Available Appointment - Schedule Surgery          Document Letter(s):  Created for Patient: Clinical Summary

## 2017-04-07 ENCOUNTER — Other Ambulatory Visit: Payer: Self-pay

## 2017-04-07 ENCOUNTER — Encounter (HOSPITAL_BASED_OUTPATIENT_CLINIC_OR_DEPARTMENT_OTHER)
Admission: RE | Disposition: A | Discharge status: Home / Self Care | Payer: Self-pay | Source: Ambulatory Visit | Attending: Urology

## 2017-04-07 ENCOUNTER — Ambulatory Visit (HOSPITAL_BASED_OUTPATIENT_CLINIC_OR_DEPARTMENT_OTHER): Payer: BLUE CROSS/BLUE SHIELD | Admitting: Anesthesiology

## 2017-04-07 ENCOUNTER — Ambulatory Visit (HOSPITAL_BASED_OUTPATIENT_CLINIC_OR_DEPARTMENT_OTHER)
Admission: RE | Admit: 2017-04-07 | Discharge: 2017-04-07 | Disposition: A | Discharge status: Home / Self Care | Payer: BLUE CROSS/BLUE SHIELD | Source: Ambulatory Visit | Attending: Urology | Admitting: Urology

## 2017-04-07 ENCOUNTER — Encounter (HOSPITAL_BASED_OUTPATIENT_CLINIC_OR_DEPARTMENT_OTHER): Payer: Self-pay | Admitting: Certified Registered"

## 2017-04-07 DIAGNOSIS — Z8639 Personal history of other endocrine, nutritional and metabolic disease: Secondary | ICD-10-CM | POA: Diagnosis not present

## 2017-04-07 DIAGNOSIS — Z886 Allergy status to analgesic agent status: Secondary | ICD-10-CM | POA: Diagnosis not present

## 2017-04-07 DIAGNOSIS — R3129 Other microscopic hematuria: Secondary | ICD-10-CM | POA: Diagnosis not present

## 2017-04-07 DIAGNOSIS — Z800 Family history of malignant neoplasm of digestive organs: Secondary | ICD-10-CM | POA: Insufficient documentation

## 2017-04-07 DIAGNOSIS — Z79899 Other long term (current) drug therapy: Secondary | ICD-10-CM | POA: Insufficient documentation

## 2017-04-07 DIAGNOSIS — I10 Essential (primary) hypertension: Secondary | ICD-10-CM | POA: Diagnosis not present

## 2017-04-07 DIAGNOSIS — R319 Hematuria, unspecified: Secondary | ICD-10-CM | POA: Insufficient documentation

## 2017-04-07 DIAGNOSIS — Z9889 Other specified postprocedural states: Secondary | ICD-10-CM | POA: Insufficient documentation

## 2017-04-07 DIAGNOSIS — Z8249 Family history of ischemic heart disease and other diseases of the circulatory system: Secondary | ICD-10-CM | POA: Insufficient documentation

## 2017-04-07 DIAGNOSIS — Z841 Family history of disorders of kidney and ureter: Secondary | ICD-10-CM | POA: Insufficient documentation

## 2017-04-07 DIAGNOSIS — Z90710 Acquired absence of both cervix and uterus: Secondary | ICD-10-CM | POA: Insufficient documentation

## 2017-04-07 DIAGNOSIS — Z820 Family history of epilepsy and other diseases of the nervous system: Secondary | ICD-10-CM | POA: Diagnosis not present

## 2017-04-07 DIAGNOSIS — R3121 Asymptomatic microscopic hematuria: Secondary | ICD-10-CM | POA: Diagnosis not present

## 2017-04-07 DIAGNOSIS — K219 Gastro-esophageal reflux disease without esophagitis: Secondary | ICD-10-CM | POA: Diagnosis not present

## 2017-04-07 HISTORY — PX: CYSTOSCOPY/RETROGRADE/URETEROSCOPY: SHX5316

## 2017-04-07 HISTORY — DX: Personal history of other benign neoplasm: Z86.018

## 2017-04-07 HISTORY — DX: Presence of spectacles and contact lenses: Z97.3

## 2017-04-07 HISTORY — DX: Personal history of adenomatous and serrated colon polyps: Z86.0101

## 2017-04-07 HISTORY — DX: Personal history of colonic polyps: Z86.010

## 2017-04-07 HISTORY — DX: Disorder of kidney and ureter, unspecified: N28.9

## 2017-04-07 HISTORY — DX: Other microscopic hematuria: R31.29

## 2017-04-07 HISTORY — DX: Unspecified osteoarthritis, unspecified site: M19.90

## 2017-04-07 HISTORY — DX: Other specified disorders of kidney and ureter: N28.89

## 2017-04-07 HISTORY — PX: URETERAL BIOPSY: SHX6688

## 2017-04-07 SURGERY — CYSTOSCOPY/RETROGRADE/URETEROSCOPY
Anesthesia: General | Laterality: Right

## 2017-04-07 MED ORDER — FENTANYL CITRATE (PF) 100 MCG/2ML IJ SOLN
INTRAMUSCULAR | Status: DC | PRN
  Administered 2017-04-07: 50 ug via INTRAVENOUS
  Administered 2017-04-07: 25 ug via INTRAVENOUS

## 2017-04-07 MED ORDER — EPHEDRINE 5 MG/ML INJ
INTRAVENOUS | Status: AC
  Filled 2017-04-07: qty 10

## 2017-04-07 MED ORDER — KETOROLAC TROMETHAMINE 30 MG/ML IJ SOLN
INTRAMUSCULAR | Status: AC
  Filled 2017-04-07: qty 1

## 2017-04-07 MED ORDER — FENTANYL CITRATE (PF) 100 MCG/2ML IJ SOLN
INTRAMUSCULAR | Status: AC
  Filled 2017-04-07: qty 2

## 2017-04-07 MED ORDER — EPHEDRINE SULFATE-NACL 50-0.9 MG/10ML-% IV SOSY
PREFILLED_SYRINGE | INTRAVENOUS | Status: DC | PRN
  Administered 2017-04-07: 10 mg via INTRAVENOUS

## 2017-04-07 MED ORDER — PROPOFOL 10 MG/ML IV BOLUS
INTRAVENOUS | Status: DC | PRN
  Administered 2017-04-07: 170 mg via INTRAVENOUS

## 2017-04-07 MED ORDER — OXYCODONE HCL 5 MG PO TABS
5.0000 mg | ORAL_TABLET | ORAL | Status: DC | PRN
  Filled 2017-04-07: qty 2

## 2017-04-07 MED ORDER — DEXAMETHASONE SODIUM PHOSPHATE 10 MG/ML IJ SOLN
INTRAMUSCULAR | Status: AC
  Filled 2017-04-07: qty 1

## 2017-04-07 MED ORDER — ACETAMINOPHEN 325 MG PO TABS
650.0000 mg | ORAL_TABLET | ORAL | Status: DC | PRN
  Filled 2017-04-07: qty 2

## 2017-04-07 MED ORDER — LIDOCAINE 2% (20 MG/ML) 5 ML SYRINGE
INTRAMUSCULAR | Status: DC | PRN
  Administered 2017-04-07: 60 mg via INTRAVENOUS

## 2017-04-07 MED ORDER — CEFAZOLIN SODIUM-DEXTROSE 2-4 GM/100ML-% IV SOLN
2.0000 g | INTRAVENOUS | Status: AC
  Administered 2017-04-07: 2 g via INTRAVENOUS
  Filled 2017-04-07: qty 100

## 2017-04-07 MED ORDER — PROMETHAZINE HCL 25 MG/ML IJ SOLN
6.2500 mg | INTRAMUSCULAR | Status: DC | PRN
  Filled 2017-04-07: qty 1

## 2017-04-07 MED ORDER — IOHEXOL 300 MG/ML  SOLN
INTRAMUSCULAR | Status: DC | PRN
  Administered 2017-04-07: 13 mL via INTRAVENOUS

## 2017-04-07 MED ORDER — KETOROLAC TROMETHAMINE 30 MG/ML IJ SOLN
INTRAMUSCULAR | Status: DC | PRN
  Administered 2017-04-07: 30 mg via INTRAVENOUS

## 2017-04-07 MED ORDER — DEXAMETHASONE SODIUM PHOSPHATE 4 MG/ML IJ SOLN
INTRAMUSCULAR | Status: DC | PRN
  Administered 2017-04-07: 10 mg via INTRAVENOUS

## 2017-04-07 MED ORDER — ACETAMINOPHEN 650 MG RE SUPP
650.0000 mg | RECTAL | Status: DC | PRN
  Filled 2017-04-07: qty 1

## 2017-04-07 MED ORDER — MIDAZOLAM HCL 5 MG/5ML IJ SOLN
INTRAMUSCULAR | Status: DC | PRN
  Administered 2017-04-07: 2 mg via INTRAVENOUS

## 2017-04-07 MED ORDER — MIDAZOLAM HCL 2 MG/2ML IJ SOLN
INTRAMUSCULAR | Status: AC
  Filled 2017-04-07: qty 2

## 2017-04-07 MED ORDER — SODIUM CHLORIDE 0.9% FLUSH
3.0000 mL | Freq: Two times a day (BID) | INTRAVENOUS | Status: DC
  Filled 2017-04-07: qty 3

## 2017-04-07 MED ORDER — HYDROCODONE-ACETAMINOPHEN 5-325 MG PO TABS
1.0000 | ORAL_TABLET | Freq: Four times a day (QID) | ORAL | 0 refills | Status: AC | PRN
Start: 2017-04-07 — End: ?

## 2017-04-07 MED ORDER — LIDOCAINE 2% (20 MG/ML) 5 ML SYRINGE
INTRAMUSCULAR | Status: AC
  Filled 2017-04-07: qty 5

## 2017-04-07 MED ORDER — ONDANSETRON HCL 4 MG/2ML IJ SOLN
INTRAMUSCULAR | Status: AC
  Filled 2017-04-07: qty 2

## 2017-04-07 MED ORDER — MORPHINE SULFATE (PF) 2 MG/ML IV SOLN
2.0000 mg | INTRAVENOUS | Status: DC | PRN
  Filled 2017-04-07: qty 1

## 2017-04-07 MED ORDER — SODIUM CHLORIDE 0.9 % IV SOLN
250.0000 mL | INTRAVENOUS | Status: DC | PRN
  Filled 2017-04-07: qty 250

## 2017-04-07 MED ORDER — HYDROMORPHONE HCL 1 MG/ML IJ SOLN
0.2500 mg | INTRAMUSCULAR | Status: DC | PRN
  Filled 2017-04-07: qty 0.5

## 2017-04-07 MED ORDER — CEFAZOLIN SODIUM-DEXTROSE 2-4 GM/100ML-% IV SOLN
INTRAVENOUS | Status: AC
  Filled 2017-04-07: qty 100

## 2017-04-07 MED ORDER — SODIUM CHLORIDE 0.9% FLUSH
3.0000 mL | INTRAVENOUS | Status: DC | PRN
  Filled 2017-04-07: qty 3

## 2017-04-07 MED ORDER — LACTATED RINGERS IV SOLN
INTRAVENOUS | Status: DC
  Administered 2017-04-07: 11:00:00 via INTRAVENOUS
  Filled 2017-04-07: qty 1000

## 2017-04-07 MED ORDER — ONDANSETRON HCL 4 MG/2ML IJ SOLN
INTRAMUSCULAR | Status: DC | PRN
  Administered 2017-04-07: 4 mg via INTRAVENOUS

## 2017-04-07 SURGICAL SUPPLY — 19 items
BAG DRAIN URO-CYSTO SKYTR STRL (DRAIN) ×4 IMPLANT
CATH URET 5FR 28IN CONE TIP (BALLOONS)
CATH URET 5FR 28IN OPEN ENDED (CATHETERS) ×4 IMPLANT
CATH URET 5FR 70CM CONE TIP (BALLOONS) IMPLANT
CLOTH BEACON ORANGE TIMEOUT ST (SAFETY) ×4 IMPLANT
GLOVE SURG SS PI 8.0 STRL IVOR (GLOVE) ×4 IMPLANT
GOWN STRL REUS W/ TWL XL LVL3 (GOWN DISPOSABLE) ×2 IMPLANT
GOWN STRL REUS W/TWL XL LVL3 (GOWN DISPOSABLE) ×2
GUIDEWIRE 0.038 PTFE COATED (WIRE) IMPLANT
GUIDEWIRE ANG ZIPWIRE 038X150 (WIRE) IMPLANT
GUIDEWIRE STR DUAL SENSOR (WIRE) ×4 IMPLANT
IV NS IRRIG 3000ML ARTHROMATIC (IV SOLUTION) ×8 IMPLANT
KIT RM TURNOVER CYSTO AR (KITS) ×4 IMPLANT
MANIFOLD NEPTUNE II (INSTRUMENTS) ×4 IMPLANT
NS IRRIG 500ML POUR BTL (IV SOLUTION) IMPLANT
PACK CYSTO (CUSTOM PROCEDURE TRAY) ×4 IMPLANT
TUBE CONNECTING 12'X1/4 (SUCTIONS)
TUBE CONNECTING 12X1/4 (SUCTIONS) IMPLANT
WATER STERILE IRR 500ML POUR (IV SOLUTION) ×4 IMPLANT

## 2017-04-07 NOTE — Discharge Instructions (Addendum)
CYSTOSCOPY HOME CARE INSTRUCTIONS  Activity: Rest for the remainder of the day.  Do not drive or operate equipment today.  You may resume normal activities in one to two days as instructed by your physician.   Meals: Drink plenty of liquids and eat light foods such as gelatin or soup this evening.  You may return to a normal meal plan tomorrow.  Return to Work: You may return to work in one to two days or as instructed by your physician.  Special Instructions / Symptoms: Call your physician if any of these symptoms occur:   -persistent or heavy bleeding  -bleeding which continues after first few urination  -large blood clots that are difficult to pass  -urine stream diminishes or stops completely  -fever equal to or higher than 101 degrees Farenheit.  -cloudy urine with a strong, foul odor  -severe pain  Females should always wipe from front to back after elimination.  You may feel some burning pain when you urinate.  This should disappear with time.  Applying moist heat to the lower abdomen or a hot tub bath may help relieve the pain. \     Post Anesthesia Home Care Instructions  Activity: Get plenty of rest for the remainder of the day. A responsible individual must stay with you for 24 hours following the procedure.  For the next 24 hours, DO NOT: -Drive a car -Operate machinery -Drink alcoholic beverages -Take any medication unless instructed by your physician -Make any legal decisions or sign important papers.  Meals: Start with liquid foods such as gelatin or soup. Progress to regular foods as tolerated. Avoid greasy, spicy, heavy foods. If nausea and/or vomiting occur, drink only clear liquids until the nausea and/or vomiting subsides. Call your physician if vomiting continues.  Special Instructions/Symptoms: Your throat may feel dry or sore from the anesthesia or the breathing tube placed in your throat during surgery. If this causes discomfort, gargle with warm salt  water. The discomfort should disappear within 24 hours.  If you had a scopolamine patch placed behind your ear for the management of post- operative nausea and/or vomiting:  1. The medication in the patch is effective for 72 hours, after which it should be removed.  Wrap patch in a tissue and discard in the trash. Wash hands thoroughly with soap and water. 2. You may remove the patch earlier than 72 hours if you experience unpleasant side effects which may include dry mouth, dizziness or visual disturbances. 3. Avoid touching the patch. Wash your hands with soap and water after contact with the patch.     

## 2017-04-07 NOTE — Transfer of Care (Signed)
Immediate Anesthesia Transfer of Care Note  Patient: JALICIA ROSZAK  Procedure(s) Performed: Procedure(s) (LRB): CYSTOSCOPY/right RETROGRADE/right URETEROSCOPy (N/A) right URETERAL BIOPSY (Right)  Patient Location: PACU  Anesthesia Type: General  Level of Consciousness: awake, oriented, sedated and patient cooperative  Airway & Oxygen Therapy: Patient Spontanous Breathing and Patient connected to face mask oxygen  Post-op Assessment: Report given to PACU RN and Post -op Vital signs reviewed and stable  Post vital signs: Reviewed and stable  Complications: No apparent anesthesia complications Last Vitals:  Vitals:   04/07/17 1014 04/07/17 1233  BP: (!) 159/83 (P) 129/85  Pulse: 61 (!) (P) 59  Resp: 16 (P) 12  Temp: 36.4 C 36.5 C    Last Pain:  Vitals:   04/07/17 1014  TempSrc: Oral

## 2017-04-07 NOTE — Brief Op Note (Signed)
04/07/2017  12:22 PM  PATIENT:  Diane Anthony  56 y.o. female  PRE-OPERATIVE DIAGNOSIS:  MICROHEMATURIA WITH RIGHT URETERAL LESION  POST-OPERATIVE DIAGNOSIS:  MICROHEMATURIA NORMAL RIGHT URETER  PROCEDURE:  Procedure(s): CYSTOSCOPY/right RETROGRADE/right URETEROSCOPY  SURGEON:  Surgeon(s) and Role:    * Irine Seal, MD - Primary  PHYSICIAN ASSISTANT:   ASSISTANTS: none   ANESTHESIA:   general  EBL:  Total I/O In: 200 [I.V.:200] Out: -   BLOOD ADMINISTERED:none  DRAINS: none   LOCAL MEDICATIONS USED:  NONE  SPECIMEN:  No Specimen  DISPOSITION OF SPECIMEN:  N/A  COUNTS:  YES  TOURNIQUET:  * No tourniquets in log *  DICTATION: .Other Dictation: Dictation Number [id number]  PLAN OF CARE: Discharge to home after PACU  PATIENT DISPOSITION:  PACU - hemodynamically stable.   Delay start of Pharmacological VTE agent (>24hrs) due to surgical blood loss or risk of bleeding: not applicable

## 2017-04-07 NOTE — Interval H&P Note (Signed)
History and Physical Interval Note:  04/07/2017 11:53 AM  Diane Anthony  has presented today for surgery, with the diagnosis of MICROHEMATURIA WITH RIGHT URETERAL LESION  The various methods of treatment have been discussed with the patient and family. After consideration of risks, benefits and other options for treatment, the patient has consented to  Procedure(s): CYSTOSCOPY/right RETROGRADE/right URETEROSCOPY/ right STENT (N/A) right URETERAL BIOPSY (Right) as a surgical intervention .  The patient's history has been reviewed, patient examined, no change in status, stable for surgery.  I have reviewed the patient's chart and labs.  Questions were answered to the patient's satisfaction.     Lizvette Lightsey J

## 2017-04-07 NOTE — Anesthesia Procedure Notes (Signed)
Procedure Name: LMA Insertion Date/Time: 04/07/2017 12:03 PM Performed by: Denna Haggard D Pre-anesthesia Checklist: Patient identified, Emergency Drugs available, Suction available and Patient being monitored Patient Re-evaluated:Patient Re-evaluated prior to inductionOxygen Delivery Method: Circle system utilized Preoxygenation: Pre-oxygenation with 100% oxygen Intubation Type: IV induction Ventilation: Mask ventilation without difficulty LMA: LMA inserted LMA Size: 4.0 Number of attempts: 1 Airway Equipment and Method: Bite block Placement Confirmation: positive ETCO2 Tube secured with: Tape Dental Injury: Teeth and Oropharynx as per pre-operative assessment

## 2017-04-07 NOTE — Anesthesia Preprocedure Evaluation (Addendum)
Anesthesia Evaluation  Patient identified by MRN, date of birth, ID band Patient awake    Reviewed: Allergy & Precautions, NPO status   History of Anesthesia Complications Negative for: history of anesthetic complications  Airway Mallampati: II   Neck ROM: Full    Dental no notable dental hx.    Pulmonary neg pulmonary ROS,    breath sounds clear to auscultation       Cardiovascular hypertension,  Rhythm:Regular Rate:Normal     Neuro/Psych    GI/Hepatic Neg liver ROS, GERD  ,  Endo/Other  negative endocrine ROS  Renal/GU negative Renal ROS     Musculoskeletal   Abdominal   Peds  Hematology negative hematology ROS (+)   Anesthesia Other Findings   Reproductive/Obstetrics                            Anesthesia Physical Anesthesia Plan  ASA: II  Anesthesia Plan: General   Post-op Pain Management:    Induction: Intravenous  Airway Management Planned: LMA  Additional Equipment:   Intra-op Plan:   Post-operative Plan: Extubation in OR  Informed Consent: I have reviewed the patients History and Physical, chart, labs and discussed the procedure including the risks, benefits and alternatives for the proposed anesthesia with the patient or authorized representative who has indicated his/her understanding and acceptance.     Plan Discussed with: CRNA  Anesthesia Plan Comments:        Anesthesia Quick Evaluation

## 2017-04-08 ENCOUNTER — Encounter (HOSPITAL_BASED_OUTPATIENT_CLINIC_OR_DEPARTMENT_OTHER): Payer: Self-pay | Admitting: Urology

## 2017-04-08 NOTE — Op Note (Signed)
NAME:  Diane Anthony, Diane Anthony                  ACCOUNT NO.:  MEDICAL RECORD NO.:  [id number]  LOCATION:                                 FACILITY:  PHYSICIAN:  Marshall Cork. Jeffie Pollock, M.D.         DATE OF BIRTH:  DATE OF PROCEDURE:  04/07/2017 DATE OF DISCHARGE:                              OPERATIVE REPORT   Patient of Dr. Irine Seal.  PROCEDURE: 1. Cystoscopy with right retrograde pyelogram and interpretation. 2. Right ureteroscopy.  PREOPERATIVE DIAGNOSIS:  Possible right mid and proximal ureteral lesions.  POSTOPERATIVE DIAGNOSIS:  No lesions found.  SURGEON:  Marshall Cork. Jeffie Pollock, M.D.  ANESTHESIA:  General.  SPECIMEN:  None.  DRAINS:  None.  COMPLICATIONS:  None.  BLOOD LOSS:  None.  INDICATIONS:  [name] is a 56 year old African American female, who recently had a hematuria.  CT revealed a possible filling defect in the right lower proximal ureter and right mid ureter.  It was felt that cystoscopy with retrograde pyelography and possible ureteroscopy were indicated.  FINDINGS AND PROCEDURE:  She was given Ancef.  She was taken to the operating room where general anesthetic was induced.  She was placed in lithotomy position.  Her perineum and genitalia were prepped with Betadine solution.  She was draped in usual sterile fashion.  Cystoscopy was performed using a 23-French scope and 30-degree lens. Examination revealed a normal urethra.  The bladder wall had mild trabeculation.  No tumors, stones, or inflammation were noted.  Ureteral orifices were unremarkable.  The right ureteral orifice was cannulated with a 5-French open-end catheter and contrast was instilled.  The right retrograde pyelogram revealed a normal ureter and intrarenal collecting system without filling defects.  Because of the subtle nature of the lesions on CT, I felt that with our very small ureteroscope, it was worthwhile to do a visual inspection of the ureter as well.  The 4.5-French semi-rigid  ureteroscope was then inserted.  Under visual guidance, I was able to easily access the ureter and transit the ureter to the level of the UPJ.  There was a small kink in the ureter at the area of the filling defect seen on CT, which could give the appearance of a filling defect on a CT scan.  No other lesions were identified and the ureter was, otherwise completely normal.  The ureteroscope was removed.  It was felt that stent was not indicated. Her bladder was drained.  She was taken down from lithotomy position. Her anesthetic was reversed and she was moved to the recovery room in stable condition.  There were no complications.     Marshall Cork. Jeffie Pollock, M.D.     JJW/MEDQ  D:  04/07/2017  T:  04/07/2017  Job:  [id number]

## 2017-04-08 NOTE — Anesthesia Postprocedure Evaluation (Addendum)
Anesthesia Post Note  Patient: Diane Anthony  Procedure(s) Performed: Procedure(s) (LRB): CYSTOSCOPY/right RETROGRADE/right URETEROSCOPy (N/A) right URETERAL BIOPSY (Right)  Patient location during evaluation: PACU Anesthesia Type: General Level of consciousness: awake and alert Pain management: pain level controlled Vital Signs Assessment: post-procedure vital signs reviewed and stable Respiratory status: spontaneous breathing, nonlabored ventilation, respiratory function stable and patient connected to nasal cannula oxygen Cardiovascular status: blood pressure returned to baseline and stable Postop Assessment: no signs of nausea or vomiting Anesthetic complications: no       Last Vitals:  Vitals:   04/07/17 1330 04/07/17 1400  BP: (!) 148/87 (!) 142/75  Pulse: (!) 59 (!) 55  Resp: 18 14  Temp:  36.7 C    Last Pain:  Vitals:   04/07/17 1300  TempSrc:   PainSc: Asleep                 [name],JAMES TERRILL

## 2017-04-19 LAB — POCT I-STAT, CHEM 8
BUN: 18 mg/dL (ref 6–20)
CALCIUM ION: 1.09 mmol/L — AB (ref 1.15–1.40)
CREATININE: 0.7 mg/dL (ref 0.44–1.00)
Chloride: 105 mmol/L (ref 101–111)
GLUCOSE: 97 mg/dL (ref 65–99)
HEMATOCRIT: 37 % (ref 36.0–46.0)
HEMOGLOBIN: 12.6 g/dL (ref 12.0–15.0)
Potassium: 4.2 mmol/L (ref 3.5–5.1)
Sodium: 140 mmol/L (ref 135–145)
TCO2: 29 mmol/L (ref 0–100)

## 2017-04-27 DIAGNOSIS — R3121 Asymptomatic microscopic hematuria: Secondary | ICD-10-CM | POA: Diagnosis not present

## 2017-05-06 NOTE — Addendum Note (Signed)
Addendum  created 05/06/17 1437 by Sharnee Douglass, MD   Sign clinical note    

## 2017-05-10 DIAGNOSIS — Z1389 Encounter for screening for other disorder: Secondary | ICD-10-CM | POA: Diagnosis not present

## 2017-05-10 DIAGNOSIS — R3121 Asymptomatic microscopic hematuria: Secondary | ICD-10-CM | POA: Diagnosis not present

## 2017-05-10 DIAGNOSIS — Z01419 Encounter for gynecological examination (general) (routine) without abnormal findings: Secondary | ICD-10-CM | POA: Diagnosis not present

## 2017-05-10 DIAGNOSIS — Z130 Encounter for screening for diseases of the blood and blood-forming organs and certain disorders involving the immune mechanism: Secondary | ICD-10-CM | POA: Diagnosis not present

## 2017-05-10 DIAGNOSIS — Z6828 Body mass index (BMI) 28.0-28.9, adult: Secondary | ICD-10-CM | POA: Diagnosis not present

## 2017-09-01 ENCOUNTER — Other Ambulatory Visit: Payer: Self-pay | Admitting: Family Medicine

## 2017-09-01 DIAGNOSIS — R918 Other nonspecific abnormal finding of lung field: Secondary | ICD-10-CM

## 2017-09-01 DIAGNOSIS — R911 Solitary pulmonary nodule: Secondary | ICD-10-CM

## 2017-09-19 ENCOUNTER — Ambulatory Visit
Admission: RE | Admit: 2017-09-19 | Discharge: 2017-09-19 | Disposition: A | Discharge status: Home / Self Care | Payer: BLUE CROSS/BLUE SHIELD | Source: Ambulatory Visit | Attending: Family Medicine | Admitting: Family Medicine

## 2017-09-19 DIAGNOSIS — R918 Other nonspecific abnormal finding of lung field: Secondary | ICD-10-CM

## 2017-09-19 DIAGNOSIS — R911 Solitary pulmonary nodule: Secondary | ICD-10-CM

## 2017-09-27 ENCOUNTER — Other Ambulatory Visit: Payer: Self-pay | Admitting: Family Medicine

## 2017-09-27 DIAGNOSIS — E041 Nontoxic single thyroid nodule: Secondary | ICD-10-CM

## 2017-09-28 DIAGNOSIS — M25531 Pain in right wrist: Secondary | ICD-10-CM | POA: Diagnosis not present

## 2017-09-29 ENCOUNTER — Ambulatory Visit: Payer: Self-pay

## 2017-11-16 DIAGNOSIS — Z0000 Encounter for general adult medical examination without abnormal findings: Secondary | ICD-10-CM | POA: Diagnosis not present

## 2017-11-16 DIAGNOSIS — E785 Hyperlipidemia, unspecified: Secondary | ICD-10-CM | POA: Diagnosis not present

## 2017-11-16 DIAGNOSIS — E041 Nontoxic single thyroid nodule: Secondary | ICD-10-CM | POA: Diagnosis not present

## 2017-11-24 ENCOUNTER — Ambulatory Visit
Admission: RE | Admit: 2017-11-24 | Discharge: 2017-11-24 | Disposition: A | Discharge status: Home / Self Care | Payer: BLUE CROSS/BLUE SHIELD | Source: Ambulatory Visit | Attending: Family Medicine | Admitting: Family Medicine

## 2017-11-24 DIAGNOSIS — E042 Nontoxic multinodular goiter: Secondary | ICD-10-CM | POA: Diagnosis not present

## 2017-11-24 DIAGNOSIS — E041 Nontoxic single thyroid nodule: Secondary | ICD-10-CM

## 2017-11-30 DIAGNOSIS — M654 Radial styloid tenosynovitis [de Quervain]: Secondary | ICD-10-CM | POA: Diagnosis not present

## 2017-11-30 DIAGNOSIS — M25531 Pain in right wrist: Secondary | ICD-10-CM | POA: Diagnosis not present

## 2017-12-07 ENCOUNTER — Other Ambulatory Visit: Payer: Self-pay | Admitting: Family Medicine

## 2017-12-07 DIAGNOSIS — E041 Nontoxic single thyroid nodule: Secondary | ICD-10-CM

## 2017-12-28 ENCOUNTER — Other Ambulatory Visit (HOSPITAL_COMMUNITY)
Admission: RE | Admit: 2017-12-28 | Discharge: 2017-12-28 | Disposition: A | Discharge status: Home / Self Care | Payer: BLUE CROSS/BLUE SHIELD | Source: Ambulatory Visit | Attending: Physician Assistant | Admitting: Physician Assistant

## 2017-12-28 ENCOUNTER — Ambulatory Visit
Admission: RE | Admit: 2017-12-28 | Discharge: 2017-12-28 | Disposition: A | Discharge status: Home / Self Care | Payer: BLUE CROSS/BLUE SHIELD | Source: Ambulatory Visit | Attending: Family Medicine | Admitting: Family Medicine

## 2017-12-28 DIAGNOSIS — E041 Nontoxic single thyroid nodule: Secondary | ICD-10-CM | POA: Diagnosis not present

## 2017-12-28 NOTE — Procedures (Signed)
PROCEDURE SUMMARY:  Using direct ultrasound guidance, 4 passes were made using 25 g needles into the nodule within the left lobe of the thyroid.   Ultrasound was used to confirm needle placements on all occasions.   Specimens were sent to Pathology for analysis.  See procedure note under Imaging tab in Epic for full procedure details.  Sterling Mondo S Henriette Hesser PA-C 12/28/2017 4:53 PM

## 2018-01-04 DIAGNOSIS — M654 Radial styloid tenosynovitis [de Quervain]: Secondary | ICD-10-CM | POA: Diagnosis not present

## 2018-03-09 ENCOUNTER — Other Ambulatory Visit: Payer: Self-pay | Admitting: Family Medicine

## 2018-03-09 DIAGNOSIS — Z1231 Encounter for screening mammogram for malignant neoplasm of breast: Secondary | ICD-10-CM

## 2018-04-03 ENCOUNTER — Ambulatory Visit
Admission: RE | Admit: 2018-04-03 | Discharge: 2018-04-03 | Disposition: A | Discharge status: Home / Self Care | Payer: BLUE CROSS/BLUE SHIELD | Source: Ambulatory Visit | Attending: Family Medicine | Admitting: Family Medicine

## 2018-04-03 DIAGNOSIS — Z1231 Encounter for screening mammogram for malignant neoplasm of breast: Secondary | ICD-10-CM

## 2018-05-17 DIAGNOSIS — E785 Hyperlipidemia, unspecified: Secondary | ICD-10-CM | POA: Diagnosis not present

## 2018-05-17 DIAGNOSIS — I10 Essential (primary) hypertension: Secondary | ICD-10-CM | POA: Diagnosis not present

## 2018-05-17 DIAGNOSIS — R8290 Unspecified abnormal findings in urine: Secondary | ICD-10-CM | POA: Diagnosis not present

## 2018-05-17 DIAGNOSIS — M25511 Pain in right shoulder: Secondary | ICD-10-CM | POA: Diagnosis not present

## 2018-05-17 DIAGNOSIS — Z23 Encounter for immunization: Secondary | ICD-10-CM | POA: Diagnosis not present

## 2018-05-29 DIAGNOSIS — Z1389 Encounter for screening for other disorder: Secondary | ICD-10-CM | POA: Diagnosis not present

## 2018-05-29 DIAGNOSIS — Z01419 Encounter for gynecological examination (general) (routine) without abnormal findings: Secondary | ICD-10-CM | POA: Diagnosis not present

## 2018-05-29 DIAGNOSIS — N898 Other specified noninflammatory disorders of vagina: Secondary | ICD-10-CM | POA: Diagnosis not present

## 2018-05-29 DIAGNOSIS — Z130 Encounter for screening for diseases of the blood and blood-forming organs and certain disorders involving the immune mechanism: Secondary | ICD-10-CM | POA: Diagnosis not present

## 2018-05-29 DIAGNOSIS — R82998 Other abnormal findings in urine: Secondary | ICD-10-CM | POA: Diagnosis not present

## 2018-05-29 DIAGNOSIS — Z6828 Body mass index (BMI) 28.0-28.9, adult: Secondary | ICD-10-CM | POA: Diagnosis not present

## 2018-05-30 DIAGNOSIS — R82998 Other abnormal findings in urine: Secondary | ICD-10-CM | POA: Diagnosis not present

## 2018-06-21 DIAGNOSIS — L680 Hirsutism: Secondary | ICD-10-CM | POA: Diagnosis not present

## 2018-08-03 DIAGNOSIS — N390 Urinary tract infection, site not specified: Secondary | ICD-10-CM | POA: Diagnosis not present

## 2018-08-03 DIAGNOSIS — R309 Painful micturition, unspecified: Secondary | ICD-10-CM | POA: Diagnosis not present

## 2018-09-14 DIAGNOSIS — Z86010 Personal history of colonic polyps: Secondary | ICD-10-CM | POA: Diagnosis not present

## 2018-09-14 DIAGNOSIS — Z800 Family history of malignant neoplasm of digestive organs: Secondary | ICD-10-CM | POA: Diagnosis not present

## 2018-09-14 DIAGNOSIS — K640 First degree hemorrhoids: Secondary | ICD-10-CM | POA: Diagnosis not present

## 2019-01-09 DIAGNOSIS — K529 Noninfective gastroenteritis and colitis, unspecified: Secondary | ICD-10-CM | POA: Diagnosis not present

## 2019-03-01 ENCOUNTER — Other Ambulatory Visit: Payer: Self-pay | Admitting: Family Medicine

## 2019-03-01 DIAGNOSIS — Z1231 Encounter for screening mammogram for malignant neoplasm of breast: Secondary | ICD-10-CM

## 2019-03-07 DIAGNOSIS — E785 Hyperlipidemia, unspecified: Secondary | ICD-10-CM | POA: Diagnosis not present

## 2019-03-07 DIAGNOSIS — R232 Flushing: Secondary | ICD-10-CM | POA: Diagnosis not present

## 2019-03-07 DIAGNOSIS — I10 Essential (primary) hypertension: Secondary | ICD-10-CM | POA: Diagnosis not present

## 2019-03-09 ENCOUNTER — Other Ambulatory Visit: Payer: Self-pay | Admitting: Family Medicine

## 2019-03-09 DIAGNOSIS — R911 Solitary pulmonary nodule: Secondary | ICD-10-CM

## 2019-04-23 ENCOUNTER — Other Ambulatory Visit: Payer: Self-pay

## 2019-04-23 ENCOUNTER — Ambulatory Visit
Admission: RE | Admit: 2019-04-23 | Discharge: 2019-04-23 | Disposition: A | Discharge status: Home / Self Care | Payer: BLUE CROSS/BLUE SHIELD | Source: Ambulatory Visit | Attending: Family Medicine | Admitting: Family Medicine

## 2019-04-23 DIAGNOSIS — Z1231 Encounter for screening mammogram for malignant neoplasm of breast: Secondary | ICD-10-CM

## 2019-05-11 DIAGNOSIS — I10 Essential (primary) hypertension: Secondary | ICD-10-CM | POA: Diagnosis not present

## 2019-05-11 DIAGNOSIS — R232 Flushing: Secondary | ICD-10-CM | POA: Diagnosis not present

## 2019-05-11 DIAGNOSIS — E785 Hyperlipidemia, unspecified: Secondary | ICD-10-CM | POA: Diagnosis not present

## 2019-06-27 DIAGNOSIS — Z01419 Encounter for gynecological examination (general) (routine) without abnormal findings: Secondary | ICD-10-CM | POA: Diagnosis not present

## 2019-06-27 DIAGNOSIS — Z130 Encounter for screening for diseases of the blood and blood-forming organs and certain disorders involving the immune mechanism: Secondary | ICD-10-CM | POA: Diagnosis not present

## 2019-06-27 DIAGNOSIS — Z6830 Body mass index (BMI) 30.0-30.9, adult: Secondary | ICD-10-CM | POA: Diagnosis not present

## 2019-06-27 DIAGNOSIS — Z1389 Encounter for screening for other disorder: Secondary | ICD-10-CM | POA: Diagnosis not present

## 2019-09-14 DIAGNOSIS — L680 Hirsutism: Secondary | ICD-10-CM | POA: Diagnosis not present

## 2019-09-14 DIAGNOSIS — E785 Hyperlipidemia, unspecified: Secondary | ICD-10-CM | POA: Diagnosis not present

## 2019-09-14 DIAGNOSIS — R232 Flushing: Secondary | ICD-10-CM | POA: Diagnosis not present

## 2019-09-14 DIAGNOSIS — I10 Essential (primary) hypertension: Secondary | ICD-10-CM | POA: Diagnosis not present

## 2019-10-09 DIAGNOSIS — E785 Hyperlipidemia, unspecified: Secondary | ICD-10-CM | POA: Diagnosis not present

## 2019-10-11 DIAGNOSIS — L731 Pseudofolliculitis barbae: Secondary | ICD-10-CM | POA: Diagnosis not present

## 2019-10-11 DIAGNOSIS — L280 Lichen simplex chronicus: Secondary | ICD-10-CM | POA: Diagnosis not present

## 2019-10-11 DIAGNOSIS — L680 Hirsutism: Secondary | ICD-10-CM | POA: Diagnosis not present

## 2020-03-13 ENCOUNTER — Other Ambulatory Visit: Payer: Self-pay | Admitting: Family Medicine

## 2020-03-13 DIAGNOSIS — Z1231 Encounter for screening mammogram for malignant neoplasm of breast: Secondary | ICD-10-CM

## 2020-04-23 ENCOUNTER — Ambulatory Visit: Payer: BLUE CROSS/BLUE SHIELD

## 2020-06-10 DIAGNOSIS — R7309 Other abnormal glucose: Secondary | ICD-10-CM | POA: Diagnosis not present

## 2020-06-10 DIAGNOSIS — R232 Flushing: Secondary | ICD-10-CM | POA: Diagnosis not present

## 2020-06-10 DIAGNOSIS — E785 Hyperlipidemia, unspecified: Secondary | ICD-10-CM | POA: Diagnosis not present

## 2020-06-10 DIAGNOSIS — I10 Essential (primary) hypertension: Secondary | ICD-10-CM | POA: Diagnosis not present

## 2020-06-12 ENCOUNTER — Ambulatory Visit
Admission: RE | Admit: 2020-06-12 | Discharge: 2020-06-12 | Disposition: A | Discharge status: Home / Self Care | Payer: BC Managed Care – PPO | Source: Ambulatory Visit | Attending: Family Medicine | Admitting: Family Medicine

## 2020-06-12 ENCOUNTER — Other Ambulatory Visit: Payer: Self-pay

## 2020-06-12 DIAGNOSIS — Z1231 Encounter for screening mammogram for malignant neoplasm of breast: Secondary | ICD-10-CM

## 2020-06-24 DIAGNOSIS — E785 Hyperlipidemia, unspecified: Secondary | ICD-10-CM | POA: Diagnosis not present

## 2020-06-24 DIAGNOSIS — R7309 Other abnormal glucose: Secondary | ICD-10-CM | POA: Diagnosis not present

## 2020-07-09 DIAGNOSIS — Z1389 Encounter for screening for other disorder: Secondary | ICD-10-CM | POA: Diagnosis not present

## 2020-07-09 DIAGNOSIS — Z130 Encounter for screening for diseases of the blood and blood-forming organs and certain disorders involving the immune mechanism: Secondary | ICD-10-CM | POA: Diagnosis not present

## 2020-07-09 DIAGNOSIS — Z6830 Body mass index (BMI) 30.0-30.9, adult: Secondary | ICD-10-CM | POA: Diagnosis not present

## 2020-07-09 DIAGNOSIS — Z01419 Encounter for gynecological examination (general) (routine) without abnormal findings: Secondary | ICD-10-CM | POA: Diagnosis not present

## 2020-08-21 DIAGNOSIS — I10 Essential (primary) hypertension: Secondary | ICD-10-CM | POA: Diagnosis not present

## 2020-08-21 DIAGNOSIS — M25511 Pain in right shoulder: Secondary | ICD-10-CM | POA: Diagnosis not present

## 2020-10-03 DIAGNOSIS — R112 Nausea with vomiting, unspecified: Secondary | ICD-10-CM | POA: Diagnosis not present

## 2020-10-08 IMAGING — MG DIGITAL SCREENING BILATERAL MAMMOGRAM WITH TOMO AND CAD
8 series · 8 of 24 positions shown · non-contrast
Comparison: Previous exam(s).

CLINICAL DATA: Screening.

EXAM:
DIGITAL SCREENING BILATERAL MAMMOGRAM WITH TOMO AND CAD

[L CC synth-2D]
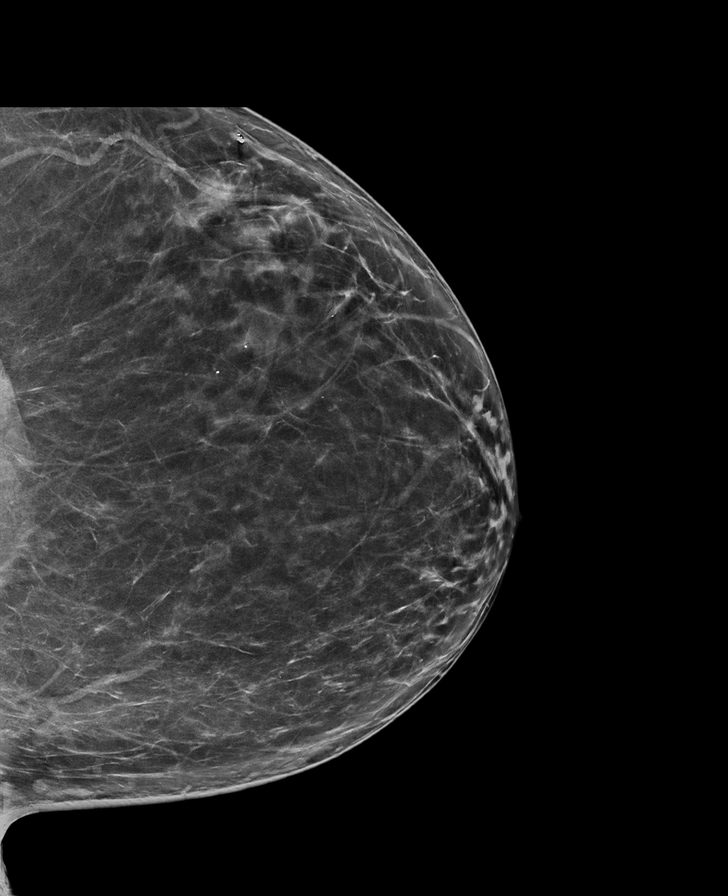

[R MLO synth-2D]
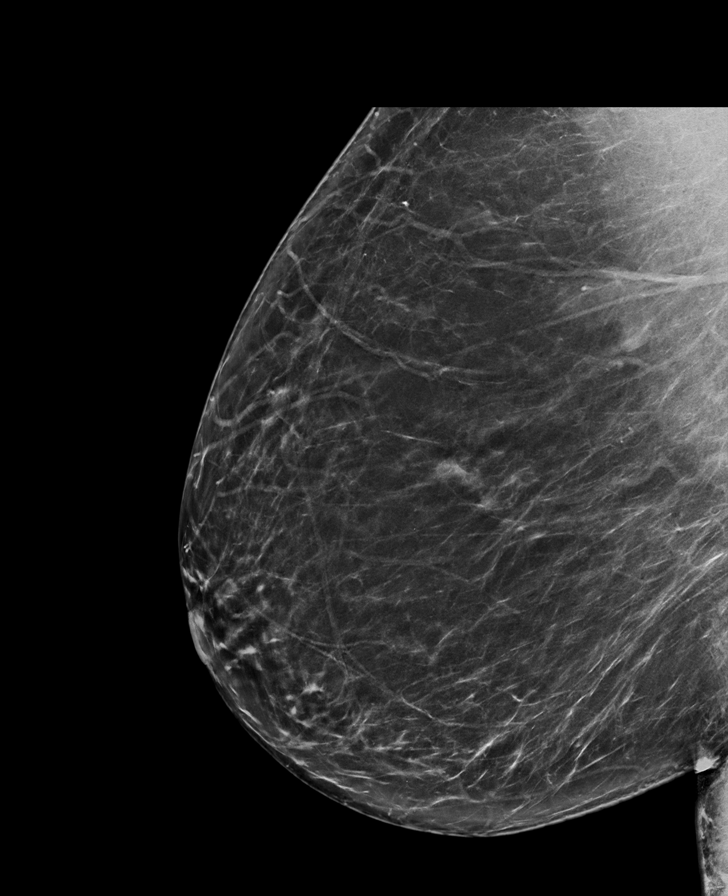

[R CC synth-2D]
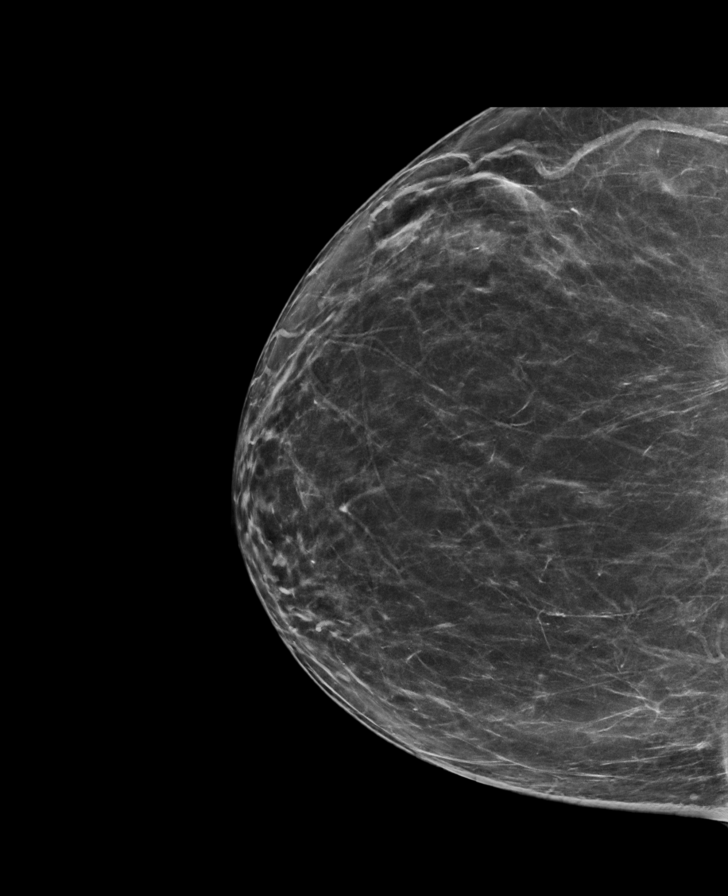

[L MLO synth-2D]
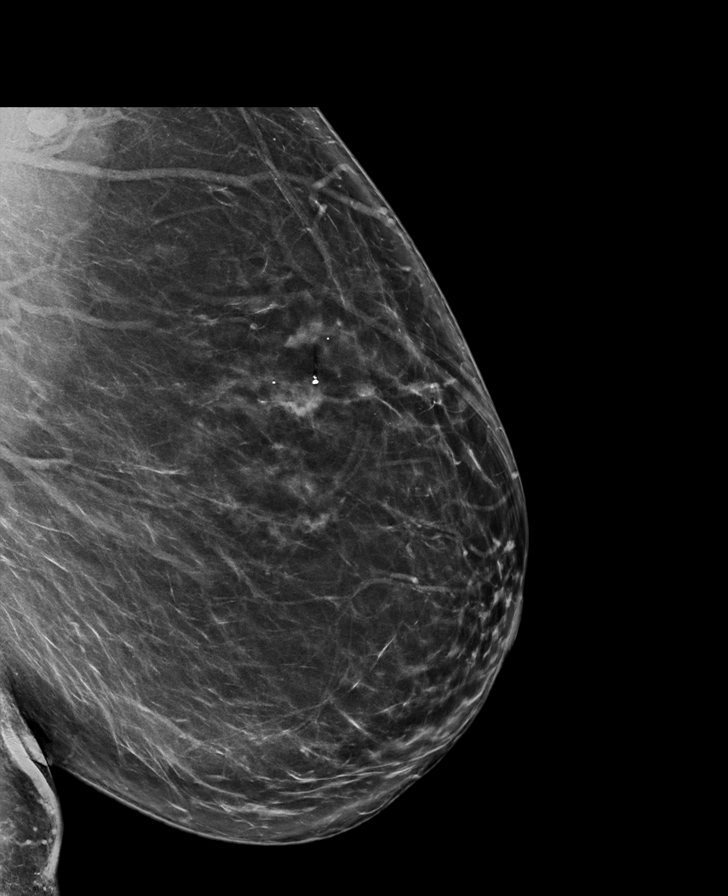

[R CC tomo · tomo slice 41/82.0]
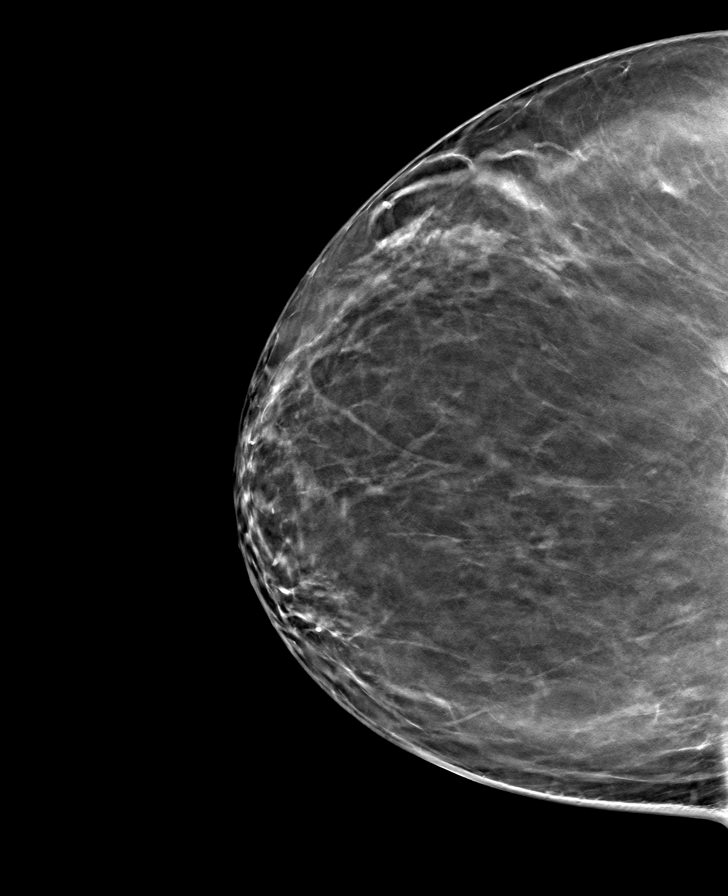

[L MLO tomo · tomo slice 43/85.0]
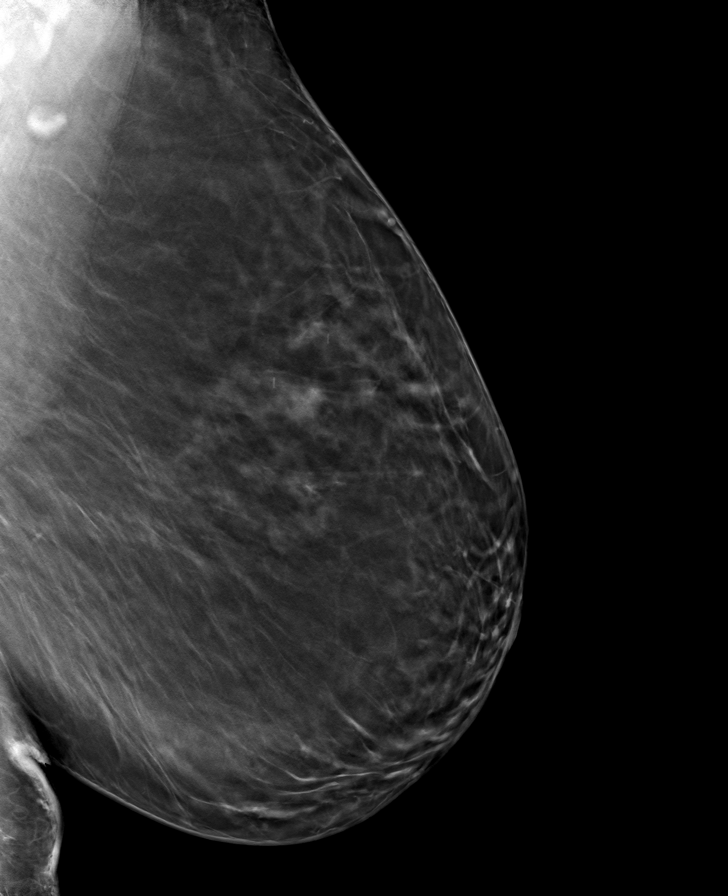

[L CC tomo · tomo slice 42/83.0]
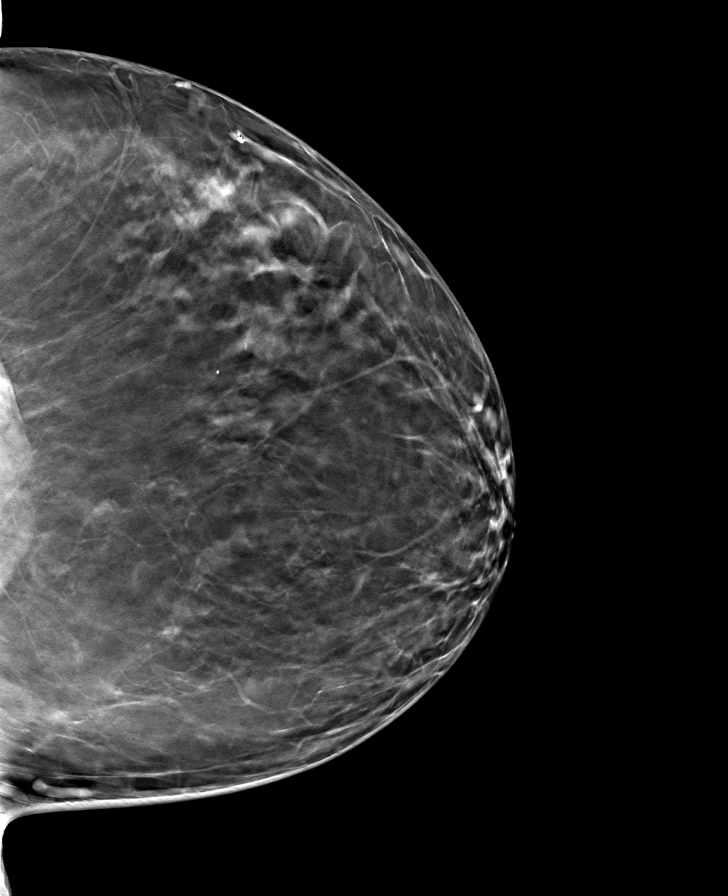

[R MLO tomo · tomo slice 43/84.0]
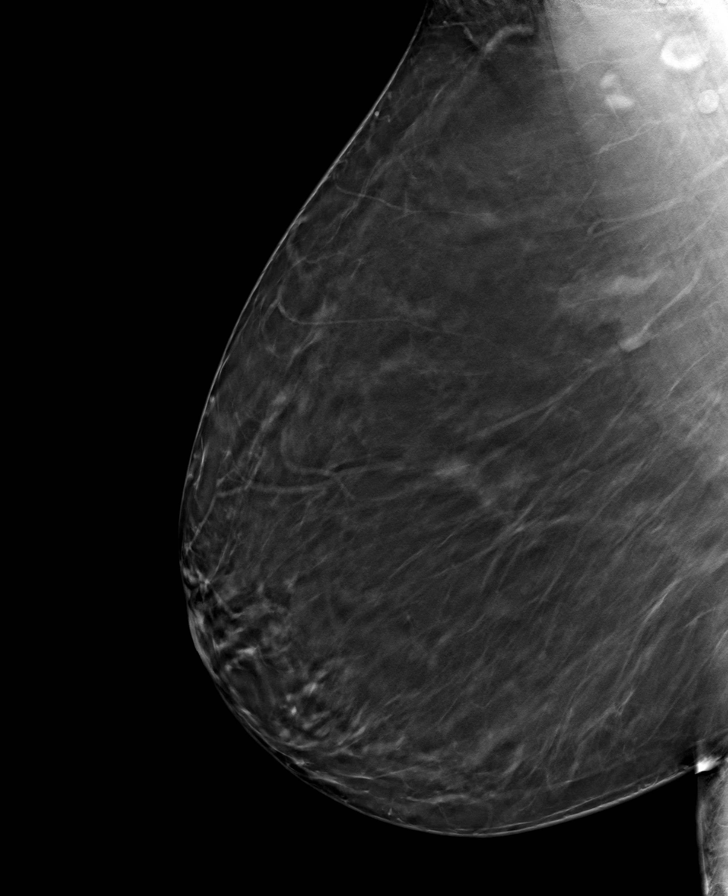

[8 of 24 positions shown; findings below may reference images not displayed]

ACR Breast Density Category b: There are scattered areas of
fibroglandular density.
FINDINGS: There are no findings suspicious for malignancy. Images were
processed with CAD.
IMPRESSION: No mammographic evidence of malignancy. A result letter of this
screening mammogram will be mailed directly to the patient.

RECOMMENDATION:
Screening mammogram in one year. (Code:CN-U-775)

BI-RADS CATEGORY  1: Negative.

## 2020-11-11 DIAGNOSIS — Z20822 Contact with and (suspected) exposure to covid-19: Secondary | ICD-10-CM | POA: Diagnosis not present

## 2020-11-25 DIAGNOSIS — Z20822 Contact with and (suspected) exposure to covid-19: Secondary | ICD-10-CM | POA: Diagnosis not present

## 2021-01-26 DIAGNOSIS — M1711 Unilateral primary osteoarthritis, right knee: Secondary | ICD-10-CM | POA: Diagnosis not present

## 2021-01-26 DIAGNOSIS — M25561 Pain in right knee: Secondary | ICD-10-CM | POA: Diagnosis not present

## 2021-01-26 DIAGNOSIS — M7541 Impingement syndrome of right shoulder: Secondary | ICD-10-CM | POA: Diagnosis not present

## 2021-01-30 DIAGNOSIS — R7309 Other abnormal glucose: Secondary | ICD-10-CM | POA: Diagnosis not present

## 2021-01-30 DIAGNOSIS — Z0000 Encounter for general adult medical examination without abnormal findings: Secondary | ICD-10-CM | POA: Diagnosis not present

## 2021-01-30 DIAGNOSIS — I10 Essential (primary) hypertension: Secondary | ICD-10-CM | POA: Diagnosis not present

## 2021-01-30 DIAGNOSIS — K219 Gastro-esophageal reflux disease without esophagitis: Secondary | ICD-10-CM | POA: Diagnosis not present

## 2021-01-30 DIAGNOSIS — E785 Hyperlipidemia, unspecified: Secondary | ICD-10-CM | POA: Diagnosis not present

## 2021-03-11 DIAGNOSIS — M1711 Unilateral primary osteoarthritis, right knee: Secondary | ICD-10-CM | POA: Diagnosis not present

## 2021-03-11 DIAGNOSIS — M7541 Impingement syndrome of right shoulder: Secondary | ICD-10-CM | POA: Diagnosis not present

## 2021-04-06 DIAGNOSIS — M25561 Pain in right knee: Secondary | ICD-10-CM | POA: Diagnosis not present

## 2021-04-15 DIAGNOSIS — S83241A Other tear of medial meniscus, current injury, right knee, initial encounter: Secondary | ICD-10-CM | POA: Diagnosis not present

## 2021-05-14 DIAGNOSIS — M94261 Chondromalacia, right knee: Secondary | ICD-10-CM | POA: Diagnosis not present

## 2021-05-14 DIAGNOSIS — X58XXXA Exposure to other specified factors, initial encounter: Secondary | ICD-10-CM | POA: Diagnosis not present

## 2021-05-14 DIAGNOSIS — G8918 Other acute postprocedural pain: Secondary | ICD-10-CM | POA: Diagnosis not present

## 2021-05-14 DIAGNOSIS — S83231A Complex tear of medial meniscus, current injury, right knee, initial encounter: Secondary | ICD-10-CM | POA: Diagnosis not present

## 2021-05-14 DIAGNOSIS — Y999 Unspecified external cause status: Secondary | ICD-10-CM | POA: Diagnosis not present

## 2021-05-21 DIAGNOSIS — M25561 Pain in right knee: Secondary | ICD-10-CM | POA: Diagnosis not present

## 2021-05-27 ENCOUNTER — Other Ambulatory Visit: Payer: Self-pay | Admitting: Family Medicine

## 2021-05-27 DIAGNOSIS — Z1231 Encounter for screening mammogram for malignant neoplasm of breast: Secondary | ICD-10-CM

## 2021-05-30 ENCOUNTER — Ambulatory Visit
Admission: RE | Admit: 2021-05-30 | Discharge: 2021-05-30 | Disposition: A | Discharge status: Home / Self Care | Payer: BC Managed Care – PPO | Source: Ambulatory Visit | Attending: Family Medicine | Admitting: Family Medicine

## 2021-05-30 ENCOUNTER — Other Ambulatory Visit: Payer: Self-pay

## 2021-05-30 DIAGNOSIS — Z1231 Encounter for screening mammogram for malignant neoplasm of breast: Secondary | ICD-10-CM

## 2021-06-02 DIAGNOSIS — M25561 Pain in right knee: Secondary | ICD-10-CM | POA: Diagnosis not present

## 2021-06-05 DIAGNOSIS — M25561 Pain in right knee: Secondary | ICD-10-CM | POA: Diagnosis not present

## 2021-06-10 DIAGNOSIS — M25561 Pain in right knee: Secondary | ICD-10-CM | POA: Diagnosis not present

## 2021-06-16 DIAGNOSIS — M25561 Pain in right knee: Secondary | ICD-10-CM | POA: Diagnosis not present

## 2021-06-19 DIAGNOSIS — M25561 Pain in right knee: Secondary | ICD-10-CM | POA: Diagnosis not present

## 2021-07-01 DIAGNOSIS — M25561 Pain in right knee: Secondary | ICD-10-CM | POA: Diagnosis not present

## 2021-07-22 ENCOUNTER — Ambulatory Visit
Admission: RE | Admit: 2021-07-22 | Discharge: 2021-07-22 | Disposition: A | Discharge status: Home / Self Care | Payer: BC Managed Care – PPO | Source: Ambulatory Visit | Attending: Family Medicine | Admitting: Family Medicine

## 2021-07-22 ENCOUNTER — Other Ambulatory Visit: Payer: Self-pay

## 2021-07-22 DIAGNOSIS — Z1231 Encounter for screening mammogram for malignant neoplasm of breast: Secondary | ICD-10-CM | POA: Diagnosis not present

## 2021-07-25 DIAGNOSIS — R059 Cough, unspecified: Secondary | ICD-10-CM | POA: Diagnosis not present

## 2021-07-25 DIAGNOSIS — U071 COVID-19: Secondary | ICD-10-CM | POA: Diagnosis not present

## 2021-07-25 DIAGNOSIS — J069 Acute upper respiratory infection, unspecified: Secondary | ICD-10-CM | POA: Diagnosis not present

## 2021-08-14 DIAGNOSIS — M1711 Unilateral primary osteoarthritis, right knee: Secondary | ICD-10-CM | POA: Diagnosis not present

## 2021-08-21 DIAGNOSIS — M1711 Unilateral primary osteoarthritis, right knee: Secondary | ICD-10-CM | POA: Diagnosis not present

## 2021-08-26 DIAGNOSIS — I10 Essential (primary) hypertension: Secondary | ICD-10-CM | POA: Diagnosis not present

## 2021-08-26 DIAGNOSIS — R232 Flushing: Secondary | ICD-10-CM | POA: Diagnosis not present

## 2021-08-26 DIAGNOSIS — E785 Hyperlipidemia, unspecified: Secondary | ICD-10-CM | POA: Diagnosis not present

## 2021-08-26 DIAGNOSIS — G4700 Insomnia, unspecified: Secondary | ICD-10-CM | POA: Diagnosis not present

## 2021-08-28 DIAGNOSIS — M1711 Unilateral primary osteoarthritis, right knee: Secondary | ICD-10-CM | POA: Diagnosis not present

## 2021-09-10 DIAGNOSIS — M7541 Impingement syndrome of right shoulder: Secondary | ICD-10-CM | POA: Diagnosis not present

## 2021-09-10 DIAGNOSIS — M25511 Pain in right shoulder: Secondary | ICD-10-CM | POA: Diagnosis not present

## 2021-09-23 DIAGNOSIS — R7309 Other abnormal glucose: Secondary | ICD-10-CM | POA: Diagnosis not present

## 2021-09-23 DIAGNOSIS — R42 Dizziness and giddiness: Secondary | ICD-10-CM | POA: Diagnosis not present

## 2021-09-24 DIAGNOSIS — M25511 Pain in right shoulder: Secondary | ICD-10-CM | POA: Diagnosis not present

## 2021-11-09 DIAGNOSIS — M1711 Unilateral primary osteoarthritis, right knee: Secondary | ICD-10-CM | POA: Diagnosis not present

## 2021-11-09 DIAGNOSIS — M75121 Complete rotator cuff tear or rupture of right shoulder, not specified as traumatic: Secondary | ICD-10-CM | POA: Diagnosis not present

## 2021-12-24 DIAGNOSIS — Y999 Unspecified external cause status: Secondary | ICD-10-CM | POA: Diagnosis not present

## 2021-12-24 DIAGNOSIS — S53431A Radial collateral ligament sprain of right elbow, initial encounter: Secondary | ICD-10-CM | POA: Diagnosis not present

## 2021-12-24 DIAGNOSIS — G8918 Other acute postprocedural pain: Secondary | ICD-10-CM | POA: Diagnosis not present

## 2021-12-24 DIAGNOSIS — S46111A Strain of muscle, fascia and tendon of long head of biceps, right arm, initial encounter: Secondary | ICD-10-CM | POA: Diagnosis not present

## 2021-12-24 DIAGNOSIS — M19011 Primary osteoarthritis, right shoulder: Secondary | ICD-10-CM | POA: Diagnosis not present

## 2021-12-24 DIAGNOSIS — S46011A Strain of muscle(s) and tendon(s) of the rotator cuff of right shoulder, initial encounter: Secondary | ICD-10-CM | POA: Diagnosis not present

## 2021-12-24 DIAGNOSIS — M7541 Impingement syndrome of right shoulder: Secondary | ICD-10-CM | POA: Diagnosis not present

## 2021-12-24 DIAGNOSIS — S43431A Superior glenoid labrum lesion of right shoulder, initial encounter: Secondary | ICD-10-CM | POA: Diagnosis not present

## 2021-12-24 DIAGNOSIS — M94211 Chondromalacia, right shoulder: Secondary | ICD-10-CM | POA: Diagnosis not present

## 2021-12-24 DIAGNOSIS — S43491A Other sprain of right shoulder joint, initial encounter: Secondary | ICD-10-CM | POA: Diagnosis not present

## 2021-12-24 DIAGNOSIS — X58XXXA Exposure to other specified factors, initial encounter: Secondary | ICD-10-CM | POA: Diagnosis not present

## 2022-01-01 DIAGNOSIS — M25511 Pain in right shoulder: Secondary | ICD-10-CM | POA: Diagnosis not present

## 2022-01-06 DIAGNOSIS — Z4789 Encounter for other orthopedic aftercare: Secondary | ICD-10-CM | POA: Diagnosis not present

## 2022-01-06 DIAGNOSIS — M25511 Pain in right shoulder: Secondary | ICD-10-CM | POA: Diagnosis not present

## 2022-01-13 DIAGNOSIS — M25511 Pain in right shoulder: Secondary | ICD-10-CM | POA: Diagnosis not present

## 2022-01-20 DIAGNOSIS — M25511 Pain in right shoulder: Secondary | ICD-10-CM | POA: Diagnosis not present

## 2022-01-26 DIAGNOSIS — M25511 Pain in right shoulder: Secondary | ICD-10-CM | POA: Diagnosis not present

## 2022-01-28 DIAGNOSIS — M25511 Pain in right shoulder: Secondary | ICD-10-CM | POA: Diagnosis not present

## 2022-02-02 DIAGNOSIS — M25511 Pain in right shoulder: Secondary | ICD-10-CM | POA: Diagnosis not present

## 2022-02-09 DIAGNOSIS — M25511 Pain in right shoulder: Secondary | ICD-10-CM | POA: Diagnosis not present

## 2022-02-11 DIAGNOSIS — M25511 Pain in right shoulder: Secondary | ICD-10-CM | POA: Diagnosis not present

## 2022-02-16 DIAGNOSIS — M25511 Pain in right shoulder: Secondary | ICD-10-CM | POA: Diagnosis not present

## 2022-02-23 DIAGNOSIS — M25511 Pain in right shoulder: Secondary | ICD-10-CM | POA: Diagnosis not present

## 2022-02-25 DIAGNOSIS — M25511 Pain in right shoulder: Secondary | ICD-10-CM | POA: Diagnosis not present

## 2022-03-04 DIAGNOSIS — M25511 Pain in right shoulder: Secondary | ICD-10-CM | POA: Diagnosis not present

## 2022-03-09 DIAGNOSIS — M25511 Pain in right shoulder: Secondary | ICD-10-CM | POA: Diagnosis not present

## 2022-03-12 DIAGNOSIS — M25511 Pain in right shoulder: Secondary | ICD-10-CM | POA: Diagnosis not present

## 2022-03-16 DIAGNOSIS — M25511 Pain in right shoulder: Secondary | ICD-10-CM | POA: Diagnosis not present

## 2022-03-18 DIAGNOSIS — M25511 Pain in right shoulder: Secondary | ICD-10-CM | POA: Diagnosis not present

## 2022-03-19 DIAGNOSIS — R7303 Prediabetes: Secondary | ICD-10-CM | POA: Diagnosis not present

## 2022-03-19 DIAGNOSIS — E785 Hyperlipidemia, unspecified: Secondary | ICD-10-CM | POA: Diagnosis not present

## 2022-03-19 DIAGNOSIS — I10 Essential (primary) hypertension: Secondary | ICD-10-CM | POA: Diagnosis not present

## 2022-03-19 DIAGNOSIS — R232 Flushing: Secondary | ICD-10-CM | POA: Diagnosis not present

## 2022-03-23 DIAGNOSIS — M25511 Pain in right shoulder: Secondary | ICD-10-CM | POA: Diagnosis not present

## 2022-04-20 DIAGNOSIS — M25511 Pain in right shoulder: Secondary | ICD-10-CM | POA: Diagnosis not present

## 2022-04-27 DIAGNOSIS — M25511 Pain in right shoulder: Secondary | ICD-10-CM | POA: Diagnosis not present

## 2022-05-05 DIAGNOSIS — Z4789 Encounter for other orthopedic aftercare: Secondary | ICD-10-CM | POA: Diagnosis not present

## 2022-05-25 DIAGNOSIS — E785 Hyperlipidemia, unspecified: Secondary | ICD-10-CM | POA: Diagnosis not present

## 2022-05-31 DIAGNOSIS — Z4789 Encounter for other orthopedic aftercare: Secondary | ICD-10-CM | POA: Diagnosis not present

## 2022-07-09 ENCOUNTER — Other Ambulatory Visit: Payer: Self-pay | Admitting: Family Medicine

## 2022-07-09 DIAGNOSIS — Z1231 Encounter for screening mammogram for malignant neoplasm of breast: Secondary | ICD-10-CM

## 2022-07-18 ENCOUNTER — Ambulatory Visit (INDEPENDENT_AMBULATORY_CARE_PROVIDER_SITE_OTHER): Payer: BC Managed Care – PPO

## 2022-07-18 ENCOUNTER — Encounter (HOSPITAL_COMMUNITY): Payer: Self-pay

## 2022-07-18 ENCOUNTER — Ambulatory Visit (HOSPITAL_COMMUNITY)
Admission: EM | Admit: 2022-07-18 | Discharge: 2022-07-18 | Disposition: A | Discharge status: Home / Self Care | Payer: BC Managed Care – PPO

## 2022-07-18 DIAGNOSIS — S59911A Unspecified injury of right forearm, initial encounter: Secondary | ICD-10-CM | POA: Diagnosis not present

## 2022-07-18 DIAGNOSIS — S59901A Unspecified injury of right elbow, initial encounter: Secondary | ICD-10-CM | POA: Diagnosis not present

## 2022-07-18 DIAGNOSIS — M79631 Pain in right forearm: Secondary | ICD-10-CM | POA: Diagnosis not present

## 2022-07-18 DIAGNOSIS — W19XXXA Unspecified fall, initial encounter: Secondary | ICD-10-CM

## 2022-07-18 MED ORDER — KETOROLAC TROMETHAMINE 30 MG/ML IJ SOLN
INTRAMUSCULAR | Status: AC
  Filled 2022-07-18: qty 1

## 2022-07-18 MED ORDER — IBUPROFEN 600 MG PO TABS: 600.0000 mg | ORAL_TABLET | Freq: Three times a day (TID) | ORAL | 2 refills | Status: AC | PRN

## 2022-07-18 MED ORDER — KETOROLAC TROMETHAMINE 30 MG/ML IJ SOLN
30.0000 mg | Freq: Once | INTRAMUSCULAR | Status: AC
  Administered 2022-07-18: 30 mg via INTRAMUSCULAR

## 2022-07-18 NOTE — Discharge Instructions (Addendum)
I recommend taking ibuprofen 600 mg 3 times daily as needed. Please take with food as this medicine can cause GI upset. You can alternate the ibuprofen with tylenol.  The sling is provided to you for support and comfort.  Follow up with the orthopedic specialists if symptoms persist.

## 2022-07-18 NOTE — ED Provider Notes (Signed)
[place]    CSN: [id number] Arrival date & time: 07/18/22  1343      History   Chief Complaint Chief Complaint  Patient presents with   Fall    HPI LATERA MCLIN is a 61 y.o. female.  Presents after a fall yesterday.  Reports she missed a step walking down the stairs, landed on her right elbow.  She did not hit her head.  Reports elbow pain has been 10/10 and she has limited range of motion.  Last night she took an oxycodone that did not help her pain.  No medicine today prior to arrival. 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  No numbness or tingling in the extremities.  No weakness.  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   S/P abdominal hysterectomy 03/19/2015    Past Surgical History:  Procedure Laterality Date   ABDOMINAL HYSTERECTOMY N/A 03/19/2015   Procedure: HYSTERECTOMY ABDOMINAL;  Surgeon: Cheri Fowler, MD;  Location: [facility];  Service: Gynecology;  Laterality: N/A;   BILATERAL SALPINGECTOMY Bilateral 03/19/2015   Procedure: BILATERAL SALPINGECTOMY;  Surgeon: Cheri Fowler, MD;  Location: [facility];  Service: Gynecology;  Laterality: Bilateral;   CYSTOSCOPY/RETROGRADE/URETEROSCOPY N/A 04/07/2017   Procedure: CYSTOSCOPY/right RETROGRADE/right URETEROSCOPy;  Surgeon: Irine Seal, MD;  Location: [facility];  Service: Urology;  Laterality: N/A;   TONSILLECTOMY  age 55   URETERAL BIOPSY Right 04/07/2017   Procedure: right URETERAL BIOPSY;  Surgeon: Irine Seal, MD;  Location: [facility];  Service:  Urology;  Laterality: Right;    OB History   No obstetric history on file.      Home Medications    Prior to Admission medications   Medication Sig Start Date End Date Taking? Authorizing Provider  ibuprofen (ADVIL) 600 MG tablet Take 1 tablet (600 mg total) by mouth 3 (three) times daily as needed. 07/18/22  Yes Kaydin Labo, Wells Guiles, PA-C  atorvastatin (LIPITOR) 20 MG tablet Take 1 tablet by mouth daily.    [provider]  BIOTIN PO Take 1 capsule by mouth daily.    [provider]  Cholecalciferol (VITAMIN D-3) 1000 units CAPS Take 1 capsule by mouth daily.    [provider]  Doxylamine Succinate, Sleep, (SLEEP AID PO) Take by mouth at bedtime as needed.    [provider]  esomeprazole (NEXIUM) 20 MG capsule Take 20 mg by mouth as needed (heartburn).     [provider]  hydrochlorothiazide (HYDRODIURIL) 25 MG tablet Take 25 mg by mouth every morning.     [provider]  HYDROcodone-acetaminophen (NORCO) 5-325 MG tablet Take 1 tablet by mouth every 6 (six) hours as needed for moderate pain. 04/07/17   Irine Seal, MD  Multiple Vitamin (MULTIVITAMIN) tablet Take 1 tablet by mouth daily.    [provider]  Omega-3 Fatty Acids (FISH OIL PO) Take 1 capsule by mouth daily.    [provider]  oxyCODONE (OXY IR/ROXICODONE) 5  MG immediate release tablet oxycodone 5 mg tablet  TAKE 1 TABLET BY MOUTH EVERY 4 HOURS    [provider]  PARoxetine (PAXIL) 30 MG tablet TAKE 1 TABLET BY MOUTH EVERY DAY IN THE MORNING FOR 90 DAYS    [provider]  simvastatin (ZOCOR) 40 MG tablet Take 40 mg by mouth every morning.     [provider]    Family History Family History  Problem Relation Age of Onset   Breast cancer Mother     Social History Social History   Tobacco Use   Smoking status: Never   Smokeless tobacco: Never  Substance Use Topics   Alcohol use: No   Drug use: No     Allergies    Aspirin   Review of Systems Review of Systems Per HPI  Physical Exam Triage Vital Signs ED Triage Vitals  Enc Vitals Group     BP 07/18/22 1449 135/77     Pulse Rate 07/18/22 1449 68     Resp 07/18/22 1449 20     Temp 07/18/22 1449 98.8 F (37.1 C)     Temp src --      SpO2 07/18/22 1449 98 %     Weight --      Height --      Head Circumference --      Peak Flow --      Pain Score 07/18/22 1446 10     Pain Loc --      Pain Edu? --      Excl. in [place]? --    No data found.  Updated Vital Signs BP 135/77   Pulse 68   Temp 98.8 F (37.1 C)   Resp 20   LMP 02/25/2015   SpO2 98%   Physical Exam Vitals and nursing note reviewed.  Constitutional:      General: She is not in acute distress.    Appearance: Normal appearance.  Eyes:     Conjunctiva/sclera: Conjunctivae normal.  Cardiovascular:     Rate and Rhythm: Normal rate and regular rhythm.     Pulses: Normal pulses.     Heart sounds: Normal heart sounds.  Pulmonary:     Effort: Pulmonary effort is normal.     Breath sounds: Normal breath sounds.  Musculoskeletal:        General: Swelling and tenderness present. No deformity.     Comments: Exam is limited by patient pain.  She has very decreased range of motion and pulls away with any light palpation.  Small area of swelling on the lateral epicondyle.  No bruising or laceration noted.  Neurological:     Mental Status: She is alert and oriented to person, place, and time.     Comments: Distal sensation intact.  Cap refill of the fingers less than 2 seconds.    UC Treatments / Results  Labs (all labs ordered are listed, but only abnormal results are displayed) Labs Reviewed - No data to display  EKG   Radiology DG Forearm Right  Result Date: 07/18/2022 CLINICAL DATA:  Trauma, fall EXAM: RIGHT FOREARM - 2 VIEW COMPARISON:  None Available. FINDINGS: There is no evidence of fracture or other focal bone lesions. Soft tissues are unremarkable. IMPRESSION: No  fracture or dislocation is seen in right forearm. Electronically Signed   By: Elmer Picker M.D.   On: 07/18/2022 15:26    Procedures Procedures (including critical care time)  Medications Ordered in UC Medications  ketorolac (TORADOL) 30  MG/ML injection 30 mg (30 mg Intramuscular Given 07/18/22 1616)    Initial Impression / Assessment and Plan / UC Course  I have reviewed the triage vital signs and the nursing notes.  Pertinent labs & imaging results that were available during my care of the patient were reviewed by me and considered in my medical decision making (see chart for details).  X-ray negative.  Toradol injection given in clinic.  Patient reports pain improved and is able to move her elbow more. Most likely soft tissue injury, inflammation.  Recommend patient try ibuprofen 3 times daily with ice.  Sling provided for comfort.  She does see [facility] for other issues and will follow-up with them as needed.  Return precautions were discussed and patient agrees to plan  Final Clinical Impressions(s) / UC Diagnoses   Final diagnoses:  Elbow injury, right, initial encounter     Discharge Instructions      I recommend taking ibuprofen 600 mg 3 times daily as needed. Please take with food as this medicine can cause GI upset. You can alternate the ibuprofen with tylenol.  The sling is provided to you for support and comfort.  Follow up with the orthopedic specialists if symptoms persist.    ED Prescriptions     Medication Sig Dispense Auth. Provider   ibuprofen (ADVIL) 600 MG tablet Take 1 tablet (600 mg total) by mouth 3 (three) times daily as needed. 30 tablet Raeven Pint, Wells Guiles, PA-C      PDMP not reviewed this encounter.   Kaid Seeberger, Wells Guiles, [place] 07/18/22 1645

## 2022-07-18 NOTE — ED Triage Notes (Signed)
Pt presents to [facility] for fall yesterday, pt reports she missed a step when walking down some stairs and fell down about 3 steps onto her R elbow. Pt reports pain is 10/10. Pt reports she took an oxy last night she had left over from shoulder surgery.

## 2022-07-23 ENCOUNTER — Ambulatory Visit
Admission: RE | Admit: 2022-07-23 | Discharge: 2022-07-23 | Disposition: A | Discharge status: Home / Self Care | Payer: BC Managed Care – PPO | Source: Ambulatory Visit | Attending: Family Medicine | Admitting: Family Medicine

## 2022-07-23 DIAGNOSIS — Z1231 Encounter for screening mammogram for malignant neoplasm of breast: Secondary | ICD-10-CM

## 2022-07-28 DIAGNOSIS — M25521 Pain in right elbow: Secondary | ICD-10-CM | POA: Diagnosis not present

## 2022-11-17 DIAGNOSIS — I10 Essential (primary) hypertension: Secondary | ICD-10-CM | POA: Diagnosis not present

## 2022-11-17 DIAGNOSIS — R7309 Other abnormal glucose: Secondary | ICD-10-CM | POA: Diagnosis not present

## 2022-11-17 DIAGNOSIS — Z0000 Encounter for general adult medical examination without abnormal findings: Secondary | ICD-10-CM | POA: Diagnosis not present

## 2022-11-17 DIAGNOSIS — E785 Hyperlipidemia, unspecified: Secondary | ICD-10-CM | POA: Diagnosis not present

## 2022-11-17 DIAGNOSIS — K219 Gastro-esophageal reflux disease without esophagitis: Secondary | ICD-10-CM | POA: Diagnosis not present

## 2023-01-10 ENCOUNTER — Ambulatory Visit: Payer: BC Managed Care – PPO | Admitting: Dietician

## 2023-02-24 DIAGNOSIS — E119 Type 2 diabetes mellitus without complications: Secondary | ICD-10-CM | POA: Diagnosis not present

## 2023-02-24 DIAGNOSIS — I10 Essential (primary) hypertension: Secondary | ICD-10-CM | POA: Diagnosis not present

## 2023-02-24 DIAGNOSIS — E785 Hyperlipidemia, unspecified: Secondary | ICD-10-CM | POA: Diagnosis not present

## 2023-03-17 DIAGNOSIS — I10 Essential (primary) hypertension: Secondary | ICD-10-CM | POA: Diagnosis not present

## 2023-06-28 DIAGNOSIS — E119 Type 2 diabetes mellitus without complications: Secondary | ICD-10-CM | POA: Diagnosis not present

## 2023-06-28 DIAGNOSIS — I10 Essential (primary) hypertension: Secondary | ICD-10-CM | POA: Diagnosis not present

## 2023-06-28 DIAGNOSIS — E785 Hyperlipidemia, unspecified: Secondary | ICD-10-CM | POA: Diagnosis not present

## 2023-06-28 DIAGNOSIS — R232 Flushing: Secondary | ICD-10-CM | POA: Diagnosis not present

## 2023-07-19 DIAGNOSIS — I10 Essential (primary) hypertension: Secondary | ICD-10-CM | POA: Diagnosis not present

## 2023-08-16 DIAGNOSIS — I10 Essential (primary) hypertension: Secondary | ICD-10-CM | POA: Diagnosis not present

## 2023-09-28 DIAGNOSIS — E119 Type 2 diabetes mellitus without complications: Secondary | ICD-10-CM | POA: Diagnosis not present

## 2023-09-28 DIAGNOSIS — I10 Essential (primary) hypertension: Secondary | ICD-10-CM | POA: Diagnosis not present

## 2023-09-28 DIAGNOSIS — E785 Hyperlipidemia, unspecified: Secondary | ICD-10-CM | POA: Diagnosis not present

## 2023-10-18 ENCOUNTER — Other Ambulatory Visit: Payer: Self-pay | Admitting: Family Medicine

## 2023-10-18 DIAGNOSIS — Z1231 Encounter for screening mammogram for malignant neoplasm of breast: Secondary | ICD-10-CM

## 2023-11-02 DIAGNOSIS — I10 Essential (primary) hypertension: Secondary | ICD-10-CM | POA: Diagnosis not present

## 2023-11-11 ENCOUNTER — Ambulatory Visit
Admission: RE | Admit: 2023-11-11 | Discharge: 2023-11-11 | Disposition: A | Discharge status: Home / Self Care | Payer: BC Managed Care – PPO | Source: Ambulatory Visit | Attending: Family Medicine | Admitting: Family Medicine

## 2023-11-11 DIAGNOSIS — Z1231 Encounter for screening mammogram for malignant neoplasm of breast: Secondary | ICD-10-CM

## 2023-12-09 DIAGNOSIS — R1013 Epigastric pain: Secondary | ICD-10-CM | POA: Diagnosis not present

## 2024-06-26 DIAGNOSIS — I10 Essential (primary) hypertension: Secondary | ICD-10-CM | POA: Diagnosis not present

## 2024-06-26 DIAGNOSIS — E785 Hyperlipidemia, unspecified: Secondary | ICD-10-CM | POA: Diagnosis not present

## 2024-06-26 DIAGNOSIS — E119 Type 2 diabetes mellitus without complications: Secondary | ICD-10-CM | POA: Diagnosis not present

## 2024-08-02 DIAGNOSIS — K219 Gastro-esophageal reflux disease without esophagitis: Secondary | ICD-10-CM | POA: Diagnosis not present

## 2024-08-02 DIAGNOSIS — E785 Hyperlipidemia, unspecified: Secondary | ICD-10-CM | POA: Diagnosis not present

## 2024-08-02 DIAGNOSIS — Z0000 Encounter for general adult medical examination without abnormal findings: Secondary | ICD-10-CM | POA: Diagnosis not present

## 2024-08-02 DIAGNOSIS — I10 Essential (primary) hypertension: Secondary | ICD-10-CM | POA: Diagnosis not present

## 2024-08-02 DIAGNOSIS — E119 Type 2 diabetes mellitus without complications: Secondary | ICD-10-CM | POA: Diagnosis not present

## 2024-08-16 DIAGNOSIS — M5412 Radiculopathy, cervical region: Secondary | ICD-10-CM | POA: Diagnosis not present

## 2024-09-03 DIAGNOSIS — M5412 Radiculopathy, cervical region: Secondary | ICD-10-CM | POA: Diagnosis not present

## 2024-09-03 DIAGNOSIS — M542 Cervicalgia: Secondary | ICD-10-CM | POA: Diagnosis not present

## 2024-10-01 DIAGNOSIS — Z09 Encounter for follow-up examination after completed treatment for conditions other than malignant neoplasm: Secondary | ICD-10-CM | POA: Diagnosis not present

## 2024-10-01 DIAGNOSIS — Z800 Family history of malignant neoplasm of digestive organs: Secondary | ICD-10-CM | POA: Diagnosis not present

## 2024-10-01 DIAGNOSIS — Z860100 Personal history of colon polyps, unspecified: Secondary | ICD-10-CM | POA: Diagnosis not present

## 2024-10-08 DIAGNOSIS — M542 Cervicalgia: Secondary | ICD-10-CM | POA: Diagnosis not present

## 2024-10-08 DIAGNOSIS — M5412 Radiculopathy, cervical region: Secondary | ICD-10-CM | POA: Diagnosis not present

## 2024-10-24 DIAGNOSIS — M5412 Radiculopathy, cervical region: Secondary | ICD-10-CM | POA: Diagnosis not present

## 2024-10-24 DIAGNOSIS — M542 Cervicalgia: Secondary | ICD-10-CM | POA: Diagnosis not present

## 2024-12-26 ENCOUNTER — Other Ambulatory Visit: Payer: Self-pay | Admitting: Family Medicine

## 2024-12-26 DIAGNOSIS — Z1231 Encounter for screening mammogram for malignant neoplasm of breast: Secondary | ICD-10-CM

## 2025-01-04 ENCOUNTER — Ambulatory Visit
Admission: RE | Admit: 2025-01-04 | Discharge: 2025-01-04 | Disposition: A | Source: Ambulatory Visit | Attending: Family Medicine | Admitting: Family Medicine

## 2025-01-04 DIAGNOSIS — Z1231 Encounter for screening mammogram for malignant neoplasm of breast: Secondary | ICD-10-CM
